# Patient Record
Sex: Female | Born: 1935 | Race: White | Hispanic: No | Marital: Married | Smoking: Former smoker
Health system: Southern US, Community
[De-identification: ages and names within clinical notes are randomized; demographics above are authoritative.]

## PROBLEM LIST (undated history)

## (undated) DIAGNOSIS — E7800 Pure hypercholesterolemia, unspecified: Secondary | ICD-10-CM

## (undated) DIAGNOSIS — E079 Disorder of thyroid, unspecified: Secondary | ICD-10-CM

## (undated) DIAGNOSIS — H409 Unspecified glaucoma: Secondary | ICD-10-CM

## (undated) DIAGNOSIS — I10 Essential (primary) hypertension: Secondary | ICD-10-CM

---

## 2008-06-13 ENCOUNTER — Encounter: Admission: RE | Admit: 2008-06-13 | Discharge: 2008-06-13 | Payer: Self-pay | Admitting: Geriatric Medicine

## 2009-03-10 ENCOUNTER — Other Ambulatory Visit: Admission: RE | Admit: 2009-03-10 | Discharge: 2009-03-10 | Payer: Self-pay | Admitting: Geriatric Medicine

## 2009-06-20 ENCOUNTER — Encounter: Admission: RE | Admit: 2009-06-20 | Discharge: 2009-06-20 | Payer: Self-pay | Admitting: Geriatric Medicine

## 2010-06-22 ENCOUNTER — Encounter: Admission: RE | Admit: 2010-06-22 | Discharge: 2010-06-22 | Payer: Self-pay | Admitting: Geriatric Medicine

## 2011-05-18 ENCOUNTER — Other Ambulatory Visit: Payer: Self-pay | Admitting: Geriatric Medicine

## 2011-05-18 DIAGNOSIS — Z1231 Encounter for screening mammogram for malignant neoplasm of breast: Secondary | ICD-10-CM

## 2011-06-25 ENCOUNTER — Ambulatory Visit
Admission: RE | Admit: 2011-06-25 | Discharge: 2011-06-25 | Disposition: A | Discharge status: Home / Self Care | Payer: Medicare Other | Source: Ambulatory Visit | Attending: Geriatric Medicine | Admitting: Geriatric Medicine

## 2011-06-25 DIAGNOSIS — Z1231 Encounter for screening mammogram for malignant neoplasm of breast: Secondary | ICD-10-CM

## 2012-05-15 ENCOUNTER — Other Ambulatory Visit: Payer: Self-pay | Admitting: Geriatric Medicine

## 2012-05-15 DIAGNOSIS — Z1231 Encounter for screening mammogram for malignant neoplasm of breast: Secondary | ICD-10-CM

## 2012-06-19 ENCOUNTER — Other Ambulatory Visit (HOSPITAL_COMMUNITY): Payer: Self-pay | Admitting: Gastroenterology

## 2012-06-19 DIAGNOSIS — K754 Autoimmune hepatitis: Secondary | ICD-10-CM

## 2012-06-22 ENCOUNTER — Other Ambulatory Visit: Payer: Self-pay | Admitting: Radiology

## 2012-06-26 ENCOUNTER — Ambulatory Visit
Admission: RE | Admit: 2012-06-26 | Discharge: 2012-06-26 | Disposition: A | Discharge status: Home / Self Care | Payer: Medicare Other | Source: Ambulatory Visit | Attending: Geriatric Medicine | Admitting: Geriatric Medicine

## 2012-06-26 DIAGNOSIS — Z1231 Encounter for screening mammogram for malignant neoplasm of breast: Secondary | ICD-10-CM

## 2012-06-29 ENCOUNTER — Encounter (HOSPITAL_COMMUNITY): Payer: Self-pay | Admitting: Pharmacy Technician

## 2012-06-30 ENCOUNTER — Ambulatory Visit (HOSPITAL_COMMUNITY)
Admission: RE | Admit: 2012-06-30 | Discharge: 2012-06-30 | Disposition: A | Discharge status: Home / Self Care | Payer: Medicare Other | Source: Ambulatory Visit | Attending: Gastroenterology | Admitting: Gastroenterology

## 2012-06-30 ENCOUNTER — Encounter (HOSPITAL_COMMUNITY): Payer: Self-pay

## 2012-06-30 DIAGNOSIS — K754 Autoimmune hepatitis: Secondary | ICD-10-CM | POA: Insufficient documentation

## 2012-06-30 DIAGNOSIS — R7989 Other specified abnormal findings of blood chemistry: Secondary | ICD-10-CM | POA: Insufficient documentation

## 2012-06-30 HISTORY — DX: Pure hypercholesterolemia, unspecified: E78.00

## 2012-06-30 HISTORY — DX: Essential (primary) hypertension: I10

## 2012-06-30 HISTORY — DX: Unspecified glaucoma: H40.9

## 2012-06-30 HISTORY — DX: Disorder of thyroid, unspecified: E07.9

## 2012-06-30 LAB — CBC
Platelets: 230 10*3/uL (ref 150–400)
RBC: 4.58 MIL/uL (ref 3.87–5.11)
WBC: 8.3 10*3/uL (ref 4.0–10.5)

## 2012-06-30 LAB — PROTIME-INR: Prothrombin Time: 14.3 seconds (ref 11.6–15.2)

## 2012-06-30 MED ORDER — MIDAZOLAM HCL 2 MG/2ML IJ SOLN
INTRAMUSCULAR | Status: DC | PRN
  Administered 2012-06-30: 0.5 mg via INTRAVENOUS

## 2012-06-30 MED ORDER — FENTANYL CITRATE 0.05 MG/ML IJ SOLN
INTRAMUSCULAR | Status: DC | PRN
  Administered 2012-06-30 (×2): 25 ug via INTRAVENOUS

## 2012-06-30 MED ORDER — FENTANYL CITRATE 0.05 MG/ML IJ SOLN
INTRAMUSCULAR | Status: AC
  Filled 2012-06-30: qty 4

## 2012-06-30 MED ORDER — MIDAZOLAM HCL 2 MG/2ML IJ SOLN
INTRAMUSCULAR | Status: AC
  Filled 2012-06-30: qty 4

## 2012-06-30 MED ORDER — SODIUM CHLORIDE 0.9 % IV SOLN
Freq: Once | INTRAVENOUS | Status: AC
  Administered 2012-06-30: 09:00:00 via INTRAVENOUS

## 2012-06-30 NOTE — H&P (Signed)
Sharon Campbell is an 77 y.o. female.   Chief Complaint: recent diagnosis of elevated liver functions Pt states she has "fatty liver" Scheduled for random liver core biopsy HPI: glaucoma; HTN; high cholesterol; hypothyroid  Past Medical History  Diagnosis Date  . Glaucoma   . Hypertension   . Thyroid disease   . High cholesterol     No past surgical history on file.  No family history on file. Social History:  reports that she has never smoked. She does not have any smokeless tobacco history on file. Her alcohol and drug histories not on file.  Allergies: No Known Allergies   (Not in a hospital admission)  Results for orders placed during the hospital encounter of 06/30/12 (from the past 48 hour(s))  APTT     Status: Normal   Collection Time   06/30/12  9:06 AM      Component Value Range Comment   aPTT 29  24 - 37 seconds   CBC     Status: Normal   Collection Time   06/30/12  9:06 AM      Component Value Range Comment   WBC 8.3  4.0 - 10.5 K/uL    RBC 4.58  3.87 - 5.11 MIL/uL    Hemoglobin 13.8  12.0 - 15.0 g/dL    HCT 16.1  09.6 - 04.5 %    MCV 88.0  78.0 - 100.0 fL    MCH 30.1  26.0 - 34.0 pg    MCHC 34.2  30.0 - 36.0 g/dL    RDW 40.9  81.1 - 91.4 %    Platelets 230  150 - 400 K/uL   PROTIME-INR     Status: Normal   Collection Time   06/30/12  9:06 AM      Component Value Range Comment   Prothrombin Time 14.3  11.6 - 15.2 seconds    INR 1.13  0.00 - 1.49    No results found.  Review of Systems  Constitutional: Negative for fever.  Respiratory: Negative for cough.   Cardiovascular: Negative for chest pain.  Gastrointestinal: Negative for nausea, vomiting and abdominal pain.  Neurological: Negative for headaches.    Blood pressure 162/57, pulse 72, temperature 97.8 F (36.6 C), temperature source Oral, resp. rate 16, height 5\' 1"  (1.549 m), weight 147 lb (66.679 kg), SpO2 98.00%. Physical Exam  Constitutional: She is oriented to person, place, and time.  She appears well-developed and well-nourished.  Cardiovascular: Normal rate, regular rhythm and normal heart sounds.   No murmur heard. Respiratory: Effort normal and breath sounds normal. She has no wheezes.  GI: Bowel sounds are normal. There is no tenderness.  Neurological: She is alert and oriented to person, place, and time.  Psychiatric: She has a normal mood and affect. Her behavior is normal. Judgment and thought content normal.     Assessment/Plan Elevated liver fxn Scheduled for liver core bx Pt aware of procedure benefits and risks and agreeable to proceed Consent signed and in chart  Adelheid Hoggard A 06/30/2012, 9:36 AM

## 2012-06-30 NOTE — Procedures (Signed)
Successful random liver core biopsy No comp Stable Full report in pacs

## 2012-06-30 NOTE — Progress Notes (Signed)
PER DR HASSELL OK TO D/C AT 1330

## 2012-06-30 NOTE — ED Notes (Signed)
O2 removed

## 2012-07-03 ENCOUNTER — Telehealth (HOSPITAL_COMMUNITY): Payer: Self-pay | Admitting: *Deleted

## 2013-04-05 ENCOUNTER — Other Ambulatory Visit: Payer: Self-pay

## 2013-04-05 DIAGNOSIS — Z1231 Encounter for screening mammogram for malignant neoplasm of breast: Secondary | ICD-10-CM

## 2013-06-27 ENCOUNTER — Ambulatory Visit
Admission: RE | Admit: 2013-06-27 | Discharge: 2013-06-27 | Disposition: A | Discharge status: Home / Self Care | Payer: Medicare Other | Source: Ambulatory Visit

## 2013-06-27 DIAGNOSIS — Z1231 Encounter for screening mammogram for malignant neoplasm of breast: Secondary | ICD-10-CM

## 2013-09-05 ENCOUNTER — Other Ambulatory Visit: Payer: Self-pay | Admitting: Dermatology

## 2014-01-18 ENCOUNTER — Other Ambulatory Visit (HOSPITAL_COMMUNITY): Payer: Medicare Other

## 2014-01-25 ENCOUNTER — Ambulatory Visit (HOSPITAL_COMMUNITY): Payer: Medicare Other | Attending: Geriatric Medicine | Admitting: Cardiology

## 2014-01-25 ENCOUNTER — Other Ambulatory Visit (HOSPITAL_COMMUNITY): Payer: Self-pay | Admitting: Cardiology

## 2014-01-25 DIAGNOSIS — I359 Nonrheumatic aortic valve disorder, unspecified: Secondary | ICD-10-CM

## 2014-01-25 NOTE — Progress Notes (Signed)
Echo performed. 

## 2014-02-18 ENCOUNTER — Other Ambulatory Visit: Payer: Self-pay | Admitting: Dermatology

## 2014-04-22 ENCOUNTER — Other Ambulatory Visit: Payer: Self-pay

## 2014-04-22 DIAGNOSIS — Z1231 Encounter for screening mammogram for malignant neoplasm of breast: Secondary | ICD-10-CM

## 2014-06-28 ENCOUNTER — Ambulatory Visit
Admission: RE | Admit: 2014-06-28 | Discharge: 2014-06-28 | Disposition: A | Discharge status: Home / Self Care | Payer: Medicare Other | Source: Ambulatory Visit

## 2014-06-28 DIAGNOSIS — Z1231 Encounter for screening mammogram for malignant neoplasm of breast: Secondary | ICD-10-CM

## 2014-11-19 ENCOUNTER — Other Ambulatory Visit: Payer: Self-pay | Admitting: Nurse Practitioner

## 2014-11-19 ENCOUNTER — Ambulatory Visit
Admission: RE | Admit: 2014-11-19 | Discharge: 2014-11-19 | Disposition: A | Discharge status: Home / Self Care | Payer: Medicare Other | Source: Ambulatory Visit | Attending: Nurse Practitioner | Admitting: Nurse Practitioner

## 2014-11-19 DIAGNOSIS — T148XXA Other injury of unspecified body region, initial encounter: Secondary | ICD-10-CM

## 2015-05-06 ENCOUNTER — Other Ambulatory Visit: Payer: Self-pay

## 2015-05-06 DIAGNOSIS — Z1231 Encounter for screening mammogram for malignant neoplasm of breast: Secondary | ICD-10-CM

## 2015-06-24 IMAGING — CR DG FACIAL BONES COMPLETE 3+V
3 series · 3 of 3 positions shown · non-contrast
Comparison: None.

CLINICAL DATA: 79-year-old female with history of trauma after
falling onto her effaced on her driveway today.

EXAM:
FACIAL BONES COMPLETE 3+V

[[person_name]]
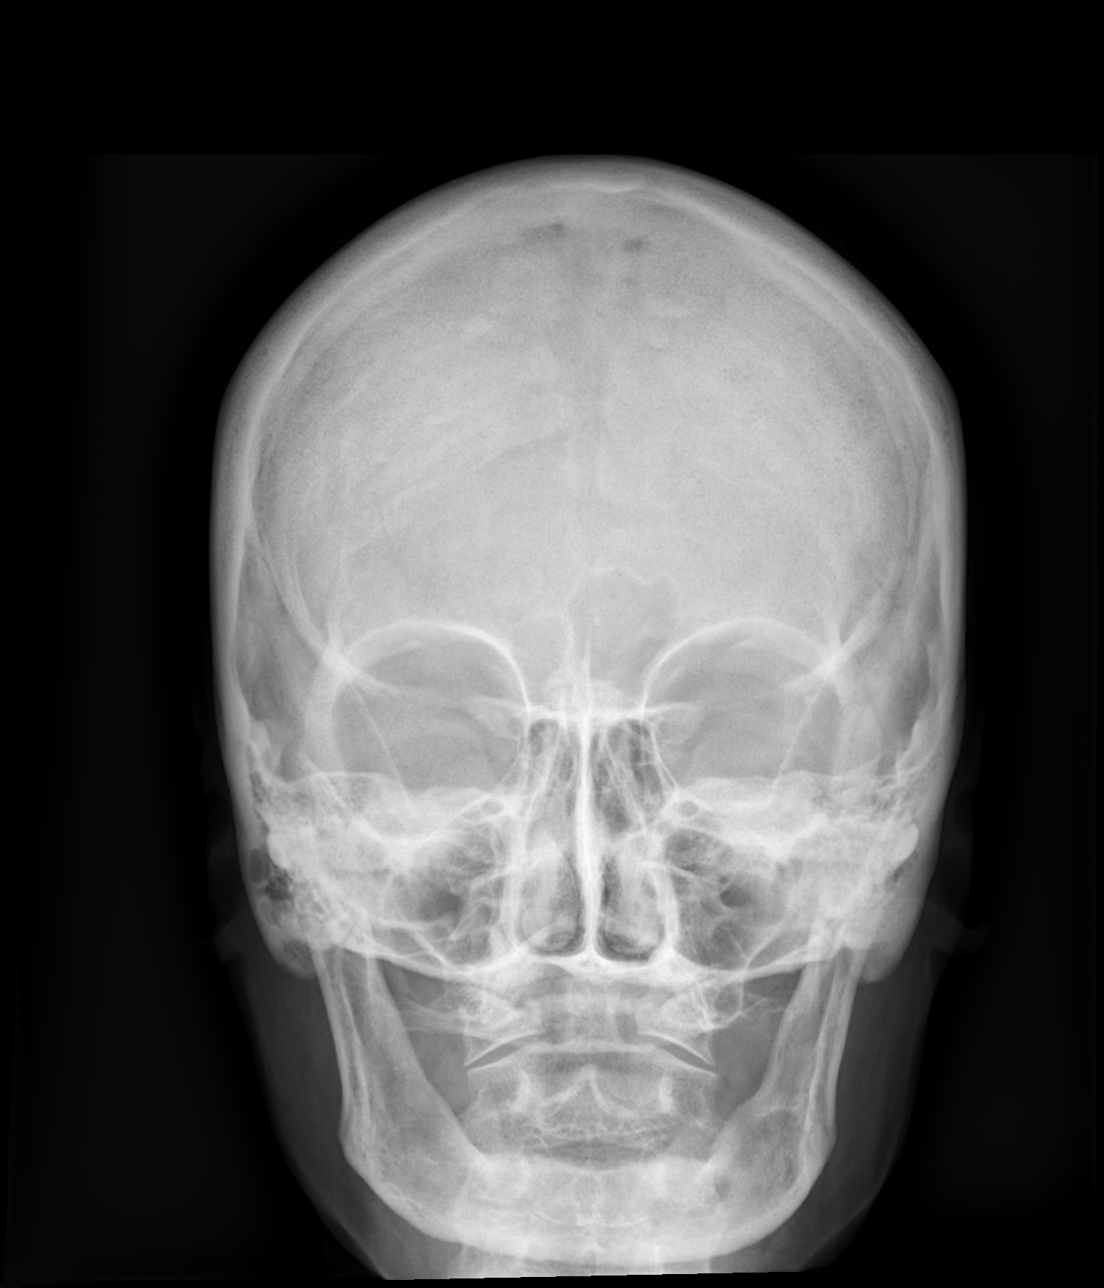

[w waters]
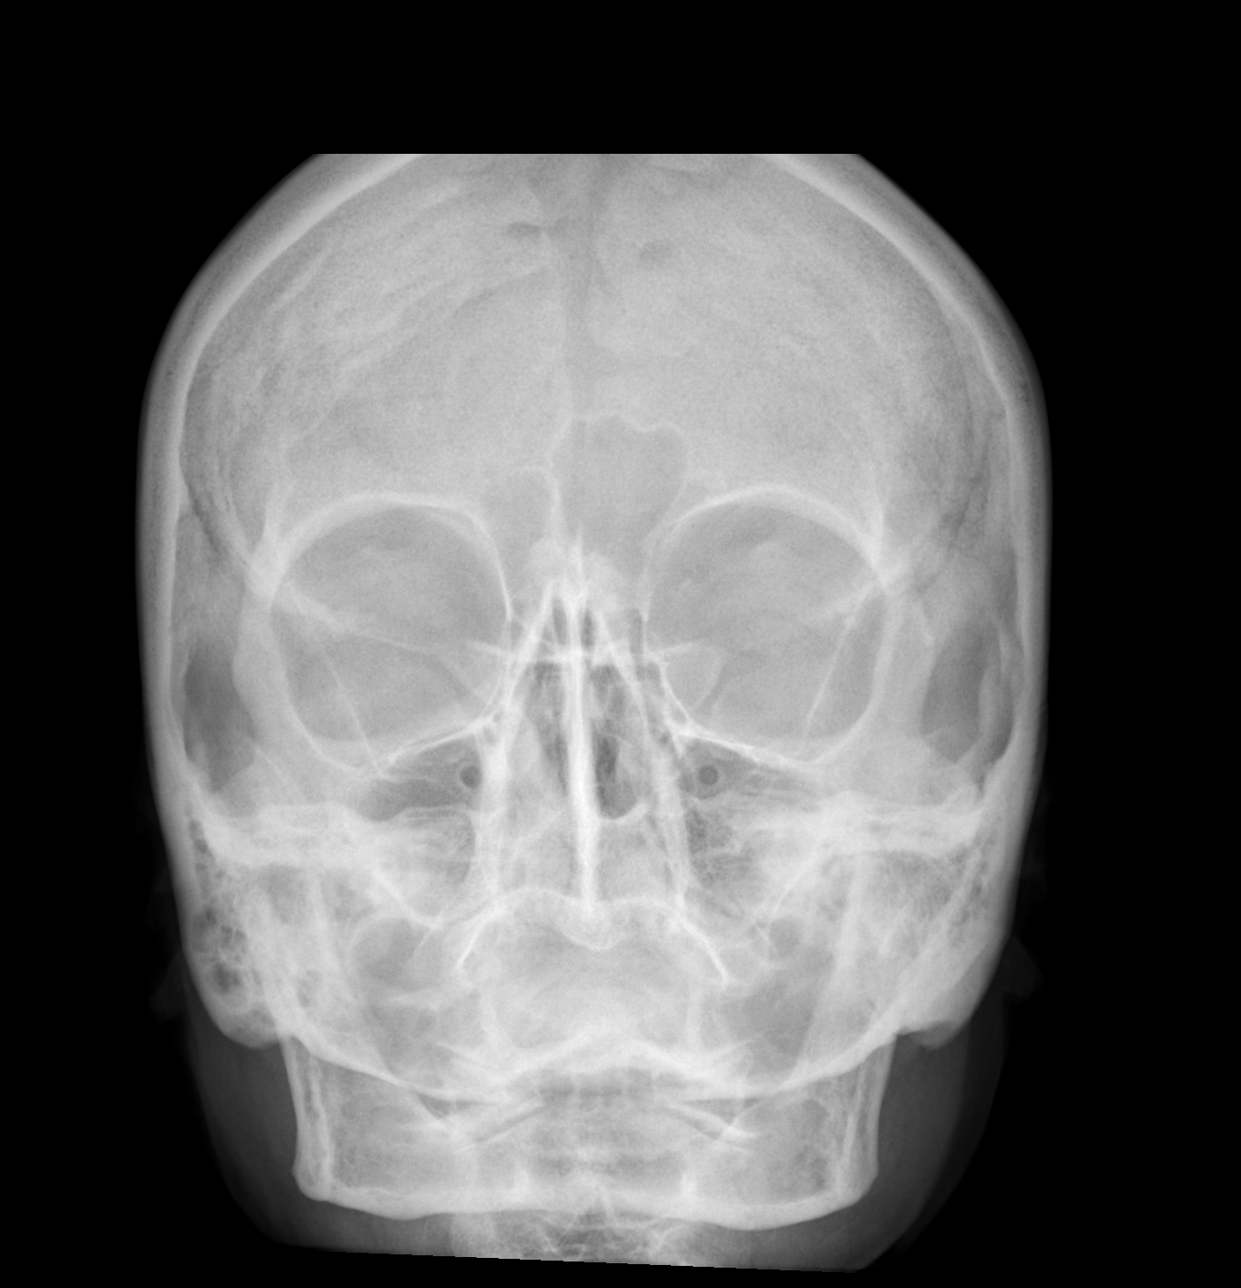

[w skull lat]
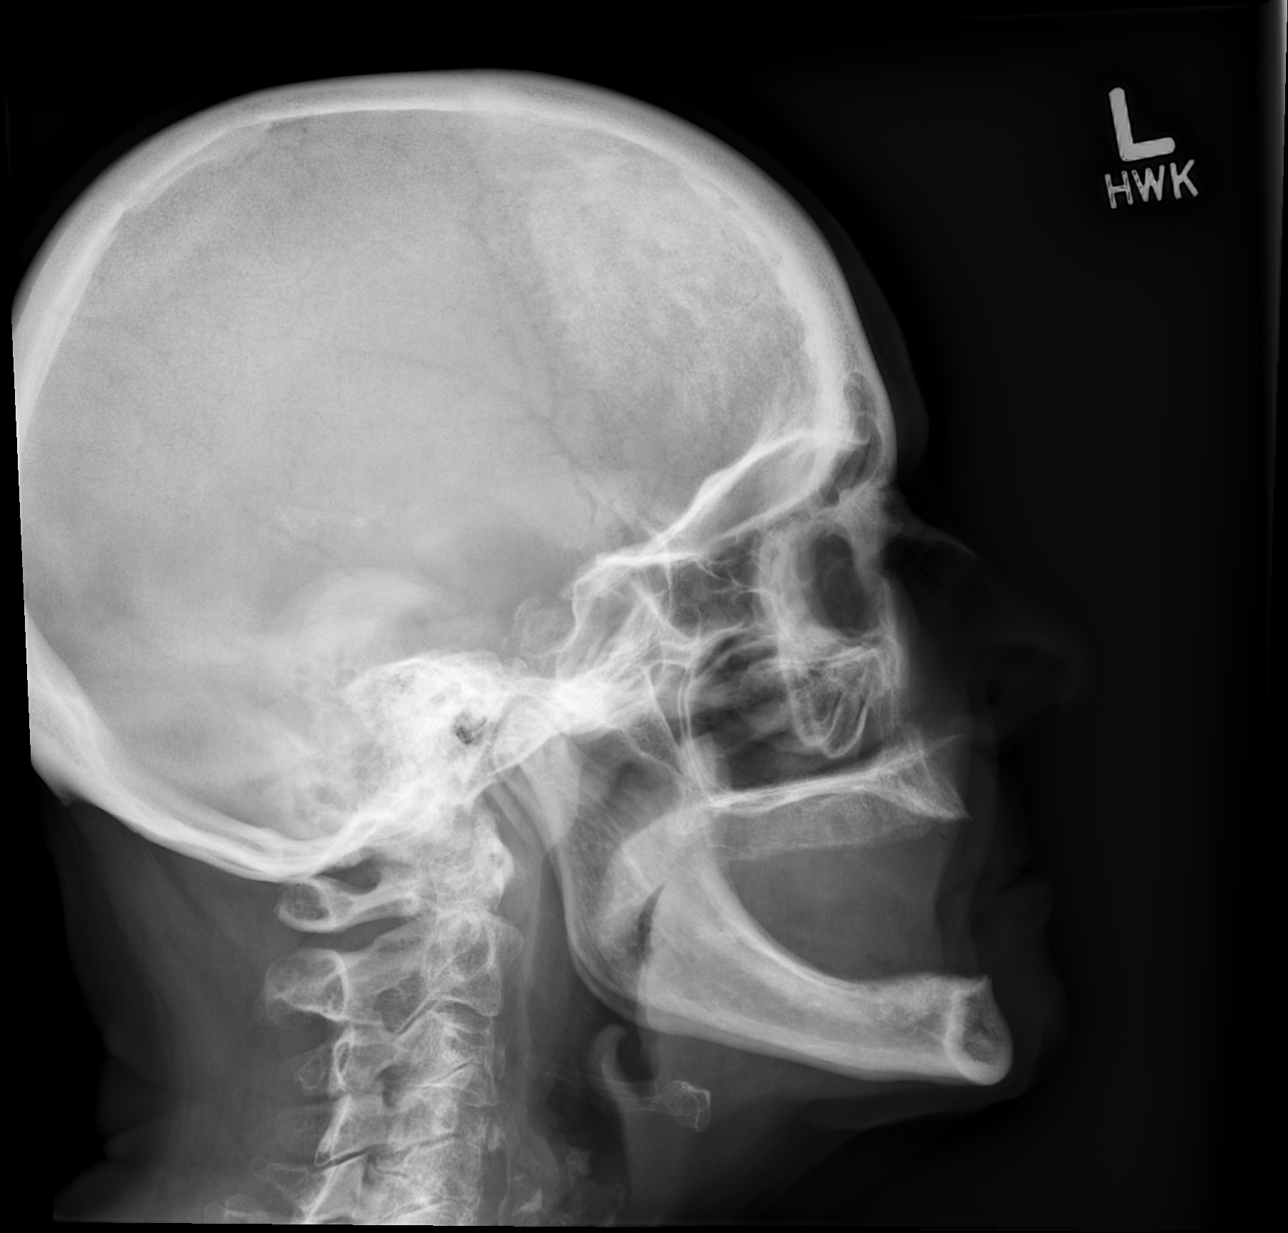

[3 of 3 positions shown; findings below may reference images not displayed]

FINDINGS: There is no evidence of fracture or other significant bone
abnormality. No orbital emphysema or sinus air-fluid levels are
seen.
IMPRESSION: Negative.

## 2015-07-02 ENCOUNTER — Ambulatory Visit
Admission: RE | Admit: 2015-07-02 | Discharge: 2015-07-02 | Disposition: A | Discharge status: Home / Self Care | Payer: Medicare Other | Source: Ambulatory Visit

## 2015-07-02 DIAGNOSIS — Z1231 Encounter for screening mammogram for malignant neoplasm of breast: Secondary | ICD-10-CM

## 2016-05-05 ENCOUNTER — Other Ambulatory Visit: Payer: Self-pay | Admitting: Geriatric Medicine

## 2016-05-05 DIAGNOSIS — Z1231 Encounter for screening mammogram for malignant neoplasm of breast: Secondary | ICD-10-CM

## 2016-07-07 ENCOUNTER — Ambulatory Visit
Admission: RE | Admit: 2016-07-07 | Discharge: 2016-07-07 | Disposition: A | Discharge status: Home / Self Care | Payer: Medicare Other | Source: Ambulatory Visit | Attending: Geriatric Medicine | Admitting: Geriatric Medicine

## 2016-07-07 DIAGNOSIS — Z1231 Encounter for screening mammogram for malignant neoplasm of breast: Secondary | ICD-10-CM

## 2016-07-09 ENCOUNTER — Other Ambulatory Visit: Payer: Self-pay | Admitting: Geriatric Medicine

## 2016-07-09 DIAGNOSIS — R928 Other abnormal and inconclusive findings on diagnostic imaging of breast: Secondary | ICD-10-CM

## 2016-07-16 ENCOUNTER — Ambulatory Visit
Admission: RE | Admit: 2016-07-16 | Discharge: 2016-07-16 | Disposition: A | Discharge status: Home / Self Care | Payer: Medicare Other | Source: Ambulatory Visit | Attending: Geriatric Medicine | Admitting: Geriatric Medicine

## 2016-07-16 DIAGNOSIS — R928 Other abnormal and inconclusive findings on diagnostic imaging of breast: Secondary | ICD-10-CM

## 2017-03-01 ENCOUNTER — Other Ambulatory Visit: Payer: Self-pay

## 2017-03-01 ENCOUNTER — Ambulatory Visit (HOSPITAL_COMMUNITY): Payer: Medicare Other | Attending: Cardiovascular Disease

## 2017-03-01 ENCOUNTER — Other Ambulatory Visit: Payer: Self-pay | Admitting: Geriatric Medicine

## 2017-03-01 DIAGNOSIS — I5030 Unspecified diastolic (congestive) heart failure: Secondary | ICD-10-CM | POA: Diagnosis not present

## 2017-03-01 DIAGNOSIS — I061 Rheumatic aortic insufficiency: Secondary | ICD-10-CM | POA: Insufficient documentation

## 2017-03-01 DIAGNOSIS — I340 Nonrheumatic mitral (valve) insufficiency: Secondary | ICD-10-CM | POA: Diagnosis not present

## 2017-03-14 ENCOUNTER — Ambulatory Visit: Payer: Medicare Other | Admitting: Cardiology

## 2017-06-08 ENCOUNTER — Other Ambulatory Visit: Payer: Self-pay | Admitting: Geriatric Medicine

## 2017-06-08 DIAGNOSIS — Z1231 Encounter for screening mammogram for malignant neoplasm of breast: Secondary | ICD-10-CM

## 2017-07-12 ENCOUNTER — Ambulatory Visit
Admission: RE | Admit: 2017-07-12 | Discharge: 2017-07-12 | Disposition: A | Discharge status: Home / Self Care | Payer: Medicare Other | Source: Ambulatory Visit | Attending: Geriatric Medicine | Admitting: Geriatric Medicine

## 2017-07-12 ENCOUNTER — Encounter (INDEPENDENT_AMBULATORY_CARE_PROVIDER_SITE_OTHER): Payer: Self-pay

## 2017-07-12 DIAGNOSIS — Z1231 Encounter for screening mammogram for malignant neoplasm of breast: Secondary | ICD-10-CM

## 2018-06-02 ENCOUNTER — Other Ambulatory Visit: Payer: Self-pay | Admitting: Geriatric Medicine

## 2018-06-02 DIAGNOSIS — Z1231 Encounter for screening mammogram for malignant neoplasm of breast: Secondary | ICD-10-CM

## 2018-07-14 ENCOUNTER — Ambulatory Visit
Admission: RE | Admit: 2018-07-14 | Discharge: 2018-07-14 | Disposition: A | Discharge status: Home / Self Care | Payer: Medicare Other | Source: Ambulatory Visit | Attending: Geriatric Medicine | Admitting: Geriatric Medicine

## 2018-07-14 DIAGNOSIS — Z1231 Encounter for screening mammogram for malignant neoplasm of breast: Secondary | ICD-10-CM

## 2019-04-18 ENCOUNTER — Other Ambulatory Visit: Payer: Self-pay | Admitting: Geriatric Medicine

## 2019-04-18 DIAGNOSIS — Z1231 Encounter for screening mammogram for malignant neoplasm of breast: Secondary | ICD-10-CM

## 2019-07-17 ENCOUNTER — Other Ambulatory Visit: Payer: Self-pay

## 2019-07-17 ENCOUNTER — Ambulatory Visit
Admission: RE | Admit: 2019-07-17 | Discharge: 2019-07-17 | Disposition: A | Discharge status: Home / Self Care | Payer: Medicare Other | Source: Ambulatory Visit | Attending: Geriatric Medicine | Admitting: Geriatric Medicine

## 2019-07-17 DIAGNOSIS — Z1231 Encounter for screening mammogram for malignant neoplasm of breast: Secondary | ICD-10-CM

## 2020-06-05 ENCOUNTER — Other Ambulatory Visit: Payer: Self-pay | Admitting: Geriatric Medicine

## 2020-06-05 DIAGNOSIS — Z1231 Encounter for screening mammogram for malignant neoplasm of breast: Secondary | ICD-10-CM

## 2020-07-17 ENCOUNTER — Ambulatory Visit: Payer: Medicare Other

## 2020-07-18 ENCOUNTER — Ambulatory Visit: Payer: Medicare Other

## 2020-07-29 ENCOUNTER — Other Ambulatory Visit: Payer: Self-pay

## 2020-07-29 ENCOUNTER — Ambulatory Visit
Admission: RE | Admit: 2020-07-29 | Discharge: 2020-07-29 | Disposition: A | Discharge status: Home / Self Care | Payer: Medicare Other | Source: Ambulatory Visit | Attending: Geriatric Medicine | Admitting: Geriatric Medicine

## 2020-07-29 DIAGNOSIS — Z1231 Encounter for screening mammogram for malignant neoplasm of breast: Secondary | ICD-10-CM

## 2020-09-08 DIAGNOSIS — N1832 Chronic kidney disease, stage 3b: Secondary | ICD-10-CM | POA: Diagnosis not present

## 2020-09-08 DIAGNOSIS — I340 Nonrheumatic mitral (valve) insufficiency: Secondary | ICD-10-CM | POA: Diagnosis not present

## 2020-09-08 DIAGNOSIS — I129 Hypertensive chronic kidney disease with stage 1 through stage 4 chronic kidney disease, or unspecified chronic kidney disease: Secondary | ICD-10-CM | POA: Diagnosis not present

## 2020-11-05 DIAGNOSIS — R7303 Prediabetes: Secondary | ICD-10-CM | POA: Diagnosis not present

## 2020-11-05 DIAGNOSIS — I129 Hypertensive chronic kidney disease with stage 1 through stage 4 chronic kidney disease, or unspecified chronic kidney disease: Secondary | ICD-10-CM | POA: Diagnosis not present

## 2020-11-05 DIAGNOSIS — Z79899 Other long term (current) drug therapy: Secondary | ICD-10-CM | POA: Diagnosis not present

## 2020-11-05 DIAGNOSIS — I340 Nonrheumatic mitral (valve) insufficiency: Secondary | ICD-10-CM | POA: Diagnosis not present

## 2020-11-05 DIAGNOSIS — N1832 Chronic kidney disease, stage 3b: Secondary | ICD-10-CM | POA: Diagnosis not present

## 2020-11-05 DIAGNOSIS — D649 Anemia, unspecified: Secondary | ICD-10-CM | POA: Diagnosis not present

## 2020-11-25 DIAGNOSIS — M79671 Pain in right foot: Secondary | ICD-10-CM | POA: Diagnosis not present

## 2020-11-25 DIAGNOSIS — E611 Iron deficiency: Secondary | ICD-10-CM | POA: Diagnosis not present

## 2020-11-28 DIAGNOSIS — K649 Unspecified hemorrhoids: Secondary | ICD-10-CM | POA: Diagnosis not present

## 2020-11-28 DIAGNOSIS — Z1211 Encounter for screening for malignant neoplasm of colon: Secondary | ICD-10-CM | POA: Diagnosis not present

## 2020-11-28 DIAGNOSIS — D509 Iron deficiency anemia, unspecified: Secondary | ICD-10-CM | POA: Diagnosis not present

## 2021-03-13 DIAGNOSIS — Z79899 Other long term (current) drug therapy: Secondary | ICD-10-CM | POA: Diagnosis not present

## 2021-03-13 DIAGNOSIS — I340 Nonrheumatic mitral (valve) insufficiency: Secondary | ICD-10-CM | POA: Diagnosis not present

## 2021-03-13 DIAGNOSIS — I351 Nonrheumatic aortic (valve) insufficiency: Secondary | ICD-10-CM | POA: Diagnosis not present

## 2021-03-13 DIAGNOSIS — Z1389 Encounter for screening for other disorder: Secondary | ICD-10-CM | POA: Diagnosis not present

## 2021-03-13 DIAGNOSIS — E039 Hypothyroidism, unspecified: Secondary | ICD-10-CM | POA: Diagnosis not present

## 2021-03-13 DIAGNOSIS — D509 Iron deficiency anemia, unspecified: Secondary | ICD-10-CM | POA: Diagnosis not present

## 2021-03-13 DIAGNOSIS — Z0000 Encounter for general adult medical examination without abnormal findings: Secondary | ICD-10-CM | POA: Diagnosis not present

## 2021-03-13 DIAGNOSIS — Z7189 Other specified counseling: Secondary | ICD-10-CM | POA: Diagnosis not present

## 2021-03-13 DIAGNOSIS — N1832 Chronic kidney disease, stage 3b: Secondary | ICD-10-CM | POA: Diagnosis not present

## 2021-03-13 DIAGNOSIS — R7303 Prediabetes: Secondary | ICD-10-CM | POA: Diagnosis not present

## 2021-03-13 DIAGNOSIS — E7800 Pure hypercholesterolemia, unspecified: Secondary | ICD-10-CM | POA: Diagnosis not present

## 2021-07-01 ENCOUNTER — Other Ambulatory Visit: Payer: Self-pay | Admitting: Geriatric Medicine

## 2021-07-01 DIAGNOSIS — Z1231 Encounter for screening mammogram for malignant neoplasm of breast: Secondary | ICD-10-CM

## 2021-08-06 ENCOUNTER — Ambulatory Visit
Admission: RE | Admit: 2021-08-06 | Discharge: 2021-08-06 | Disposition: A | Discharge status: Home / Self Care | Payer: Medicare Other | Source: Ambulatory Visit | Attending: Geriatric Medicine | Admitting: Geriatric Medicine

## 2021-08-06 DIAGNOSIS — Z1231 Encounter for screening mammogram for malignant neoplasm of breast: Secondary | ICD-10-CM | POA: Diagnosis not present

## 2021-09-06 ENCOUNTER — Encounter (HOSPITAL_COMMUNITY): Payer: Self-pay | Admitting: Emergency Medicine

## 2021-09-06 ENCOUNTER — Observation Stay (HOSPITAL_COMMUNITY)
Admission: EM | Admit: 2021-09-06 | Discharge: 2021-09-08 | Disposition: A | Discharge status: Home / Self Care | Payer: Medicare Other | Attending: Family Medicine | Admitting: Family Medicine

## 2021-09-06 ENCOUNTER — Emergency Department (HOSPITAL_COMMUNITY): Payer: Medicare Other

## 2021-09-06 ENCOUNTER — Other Ambulatory Visit: Payer: Self-pay

## 2021-09-06 DIAGNOSIS — R404 Transient alteration of awareness: Secondary | ICD-10-CM | POA: Diagnosis not present

## 2021-09-06 DIAGNOSIS — I110 Hypertensive heart disease with heart failure: Secondary | ICD-10-CM | POA: Insufficient documentation

## 2021-09-06 DIAGNOSIS — Z79899 Other long term (current) drug therapy: Secondary | ICD-10-CM | POA: Insufficient documentation

## 2021-09-06 DIAGNOSIS — R002 Palpitations: Secondary | ICD-10-CM | POA: Insufficient documentation

## 2021-09-06 DIAGNOSIS — R778 Other specified abnormalities of plasma proteins: Secondary | ICD-10-CM | POA: Insufficient documentation

## 2021-09-06 DIAGNOSIS — I351 Nonrheumatic aortic (valve) insufficiency: Secondary | ICD-10-CM

## 2021-09-06 DIAGNOSIS — Z20822 Contact with and (suspected) exposure to covid-19: Secondary | ICD-10-CM | POA: Insufficient documentation

## 2021-09-06 DIAGNOSIS — R55 Syncope and collapse: Secondary | ICD-10-CM | POA: Diagnosis not present

## 2021-09-06 DIAGNOSIS — R011 Cardiac murmur, unspecified: Secondary | ICD-10-CM

## 2021-09-06 DIAGNOSIS — N179 Acute kidney failure, unspecified: Secondary | ICD-10-CM | POA: Insufficient documentation

## 2021-09-06 DIAGNOSIS — I10 Essential (primary) hypertension: Secondary | ICD-10-CM | POA: Diagnosis not present

## 2021-09-06 DIAGNOSIS — Y93E8 Activity, other personal hygiene: Secondary | ICD-10-CM | POA: Insufficient documentation

## 2021-09-06 DIAGNOSIS — I517 Cardiomegaly: Secondary | ICD-10-CM | POA: Diagnosis not present

## 2021-09-06 DIAGNOSIS — Z743 Need for continuous supervision: Secondary | ICD-10-CM | POA: Diagnosis not present

## 2021-09-06 DIAGNOSIS — R6889 Other general symptoms and signs: Secondary | ICD-10-CM | POA: Diagnosis not present

## 2021-09-06 DIAGNOSIS — R112 Nausea with vomiting, unspecified: Secondary | ICD-10-CM | POA: Diagnosis not present

## 2021-09-06 DIAGNOSIS — W1839XA Other fall on same level, initial encounter: Secondary | ICD-10-CM | POA: Diagnosis not present

## 2021-09-06 DIAGNOSIS — J9811 Atelectasis: Secondary | ICD-10-CM | POA: Diagnosis not present

## 2021-09-06 DIAGNOSIS — I5032 Chronic diastolic (congestive) heart failure: Secondary | ICD-10-CM | POA: Diagnosis not present

## 2021-09-06 DIAGNOSIS — R7989 Other specified abnormal findings of blood chemistry: Secondary | ICD-10-CM

## 2021-09-06 LAB — CBC
HCT: 34.8 % — ABNORMAL LOW (ref 36.0–46.0)
Hemoglobin: 11 g/dL — ABNORMAL LOW (ref 12.0–15.0)
MCH: 28.6 pg (ref 26.0–34.0)
MCHC: 31.6 g/dL (ref 30.0–36.0)
MCV: 90.4 fL (ref 80.0–100.0)
Platelets: 246 10*3/uL (ref 150–400)
RBC: 3.85 MIL/uL — ABNORMAL LOW (ref 3.87–5.11)
RDW: 14.1 % (ref 11.5–15.5)
WBC: 7.4 10*3/uL (ref 4.0–10.5)
nRBC: 0 % (ref 0.0–0.2)

## 2021-09-06 LAB — BASIC METABOLIC PANEL
Anion gap: 10 (ref 5–15)
BUN: 33 mg/dL — ABNORMAL HIGH (ref 8–23)
CO2: 26 mmol/L (ref 22–32)
Calcium: 9.3 mg/dL (ref 8.9–10.3)
Chloride: 102 mmol/L (ref 98–111)
Creatinine, Ser: 1.33 mg/dL — ABNORMAL HIGH (ref 0.44–1.00)
GFR, Estimated: 39 mL/min — ABNORMAL LOW (ref >60)
Glucose, Bld: 118 mg/dL — ABNORMAL HIGH (ref 70–99)
Potassium: 4.1 mmol/L (ref 3.5–5.1)
Sodium: 138 mmol/L (ref 135–145)

## 2021-09-06 LAB — CBG MONITORING, ED: Glucose-Capillary: 106 mg/dL — ABNORMAL HIGH (ref 70–99)

## 2021-09-06 NOTE — ED Triage Notes (Signed)
Pt arrived via EMS from home. Pt has been feeling nauseous throughout the day. Pt had a syncopal episode while bending over and hit her head on the padded carpet. Pt denies blood thinners. Pt denies head, neck, or back pain.

## 2021-09-06 NOTE — ED Provider Notes (Signed)
[facility] DEPT Provider Note   CSN: [id number] Arrival date & time: 09/06/21  2229     History  Chief Complaint  Patient presents with   Loss of Consciousness   Fall    Sharon Campbell is a 87 y.o. female.   Loss of Consciousness Fall   87 year old female with a medical history significant for hypertension, thyroid disease, hyperlipidemia, echocardiogram in 2018 which revealed moderate aortic regurgitation who presents to the emergency department with a near syncopal episode.  The patient states that she was under some stress because her husband likely has undiagnosed dementia and has been causing her increased stress at home.  She felt stressed and Felt nauseated earlier today.  She felt diaphoretic and bent over and had a episode of heart palpitations and near syncope.  She states that she went down to the ground, did not hit her head or lose consciousness.  She is not on blood thinners.  She denies any headache, neck pain or back pain.  She states that she lowered herself to the ground and felt better after being on the ground for short period of time.  No further episodes of near syncope.  No episodes of full syncope.  She denies any chest pain or shortness of breath.  No fevers or chills.  No neurologic complaints.  She is back to her baseline at this time.  Home Medications Prior to Admission medications   Medication Sig Start Date End Date Taking? Authorizing Provider  acetaminophen (TYLENOL) 500 MG tablet Take 500 mg by mouth daily as needed for headache.   Yes [provider]  ALPRAZolam (XANAX) 0.25 MG tablet Take 0.5 tablets by mouth at bedtime. For anxiety 06/01/12  Yes [provider]  atorvastatin (LIPITOR) 10 MG tablet Take 10 mg by mouth daily. 09/01/21  Yes [provider]  dorzolamide-timolol (COSOPT) 22.3-6.8 MG/ML ophthalmic solution Place 1 drop into the right eye at bedtime. 06/24/12  Yes [provider]  fluvoxaMINE (LUVOX) 100 MG tablet Take 200 tablets by mouth at bedtime. 06/18/12  Yes [provider]  latanoprost (XALATAN) 0.005 % ophthalmic solution Place 1 drop into both eyes at bedtime.  06/19/12  Yes [provider]  Multiple Vitamin (MULTIVITAMIN WITH MINERALS) TABS Take 1 tablet by mouth daily.   Yes [provider]  SYNTHROID 88 MCG tablet Take 88 mcg by mouth daily.  06/12/12  Yes [provider]      Allergies    Patient has no known allergies.    Review of Systems   Review of Systems  Cardiovascular:  Positive for syncope.   Physical Exam Updated Vital Signs BP (!) 173/80    Pulse 71    Temp (!) 97.4 F (36.3 C) (Oral)    Resp 16    Ht 5' (1.524 m)    Wt 51.3 kg    SpO2 96%    BMI 22.07 kg/m  Physical Exam Vitals and nursing note reviewed.  Constitutional:      General: She is not in acute distress.    Appearance: She is well-developed.  HENT:     Head: Normocephalic and atraumatic.  Eyes:     Conjunctiva/sclera: Conjunctivae normal.     Pupils: Pupils are equal, round, and reactive to light.  Cardiovascular:     Rate and Rhythm: Normal rate and regular rhythm.     Chest Wall: No thrill.     Heart sounds: Murmur heard.  Systolic murmur is present.  Pulmonary:     Effort: Pulmonary effort is normal. No respiratory distress.     Breath sounds: Normal breath sounds.  Abdominal:     General: There is no distension.     Palpations: Abdomen is soft.     Tenderness: There is no abdominal tenderness. There is no guarding.  Musculoskeletal:        General: No swelling, deformity or signs of injury.     Cervical back: Neck supple.  Skin:    General: Skin is warm and dry.     Capillary Refill: Capillary refill takes less than 2 seconds.     Findings: No lesion or rash.  Neurological:     General: No focal deficit present.     Mental Status: She is alert. Mental status is at baseline.  Psychiatric:        Mood  and Affect: Mood normal.    ED Results / Procedures / Treatments   Labs (all labs ordered are listed, but only abnormal results are displayed) Labs Reviewed  BASIC METABOLIC PANEL - Abnormal; Notable for the following components:      Result Value   Glucose, Bld 118 (*)    BUN 33 (*)    Creatinine, Ser 1.33 (*)    GFR, Estimated 39 (*)    All other components within normal limits  CBC - Abnormal; Notable for the following components:   RBC 3.85 (*)    Hemoglobin 11.0 (*)    HCT 34.8 (*)    All other components within normal limits  CBG MONITORING, ED - Abnormal; Notable for the following components:   Glucose-Capillary 106 (*)    All other components within normal limits  TROPONIN I (HIGH SENSITIVITY) - Abnormal; Notable for the following components:   Troponin I (High Sensitivity) 74 (*)    All other components within normal limits  TROPONIN I (HIGH SENSITIVITY) - Abnormal; Notable for the following components:   Troponin I (High Sensitivity) 81 (*)    All other components within normal limits  RESP PANEL BY RT-PCR (FLU A&B, COVID) ARPGX2  URINALYSIS, ROUTINE W REFLEX MICROSCOPIC    EKG EKG Interpretation  Date/Time:  Sunday September 06 2021 22:48:43 EST Ventricular Rate:  69 PR Interval:  180 QRS Duration: 86 QT Interval:  411 QTC Calculation: 441 R Axis:   42 Text Interpretation: Sinus rhythm Probable left atrial enlargement Repol abnrm, severe global ischemia (LM/MVD) Artifact in lead(s) I II aVR aVL aVF Confirmed by Regan Lemming ([id number]) on 09/06/2021 11:24:38 PM  Radiology DG Chest Portable 1 View  Result Date: 09/06/2021 CLINICAL DATA:  Syncope. EXAM: PORTABLE CHEST 1 VIEW COMPARISON:  None. FINDINGS: No focal consolidation, pleural effusion or pneumothorax. Minimal left lung base atelectasis. Borderline cardiomegaly. Atherosclerotic calcification of the aorta. No acute osseous pathology. IMPRESSION: No active disease. Electronically Signed   By: Anner Crete M.D.    On: 09/06/2021 23:25    Procedures Procedures    Medications Ordered in ED Medications - No data to display  ED Course/ Medical Decision Making/ A&P                           Medical Decision Making  87 year old female with a medical history significant for hypertension, thyroid disease, hyperlipidemia, echocardiogram in 2018 which revealed moderate aortic regurgitation who presents to the emergency department with a near syncopal episode.  The patient states that she was under some stress because her husband likely has  undiagnosed dementia and has been causing her increased stress at home.  She felt stressed and Felt nauseated earlier today.  She felt diaphoretic and bent over and had a episode of heart palpitations and near syncope.  She states that she went down to the ground, did not hit her head or lose consciousness.  She is not on blood thinners.  She denies any headache, neck pain or back pain.  She states that she lowered herself to the ground and felt better after being on the ground for short period of time.  No further episodes of near syncope.  No episodes of full syncope.  She denies any chest pain or shortness of breath.  No fevers or chills.  No neurologic complaints.  She is back to her baseline at this time.  On arrival, the patient was afebrile, not tachycardic or tachypneic, hypertensive BP 160/62, saturating 98% on room air.  Cardiac telemetry was reviewed and revealed normal sinus rhythm.  An EKG was performed and did not reveal acute ST segment changes to indicate coronary ischemia.  The patient denies symptoms of ACS at this time.  She presents with a near syncopal episode  Differential diagnosis includes vasovagal syncope, cardiogenic etiology of her syncope given her history of structural cardiac abnormalities with moderate aortic regurgitation.  Patient denies any chest pain or shortness of breath at this time.  Low concern for PE.  Low concern for ACS.  Low concern for  aortic dissection or AAA.  Did consider worsening valvular abnormality as the patient has a large holosystolic murmur noted on exam.  Patient under went chest x-ray imaging which revealed no focal cardiac or pulmonary abnormality.  Chest x-ray imaging was reviewed myself and radiology.  No evidence of widening mediastinum.  No evidence of focal consolidation to suggest pneumonia.  Laboratory work-up significant for hemoglobin of 11, no leukocytosis or platelet abnormality, BMP generally unremarkable.  CBG normal.  Patient's creatinine was elevated to 1.33 and had an elevated BUN to 33.  There is no prior BMP measurements for comparison of the patient's baseline creatinine.  Additionally, there is a years ago baseline hemoglobin of 13.  The patient denies any rectal bleeding, hematochezia or melena.  Low concern for acute GI bleed at this time.  Patient's initial troponin was elevated to 74.  I spoke with on-call cardiology who did recommend admission for observation on cardiac telemetry and repeat echocardiogram to given her history of aortic dilatation and near syncopal episode earlier tonight.  A repeat troponin was found to be flat at 81.  I do not think the patient needs transfer for admission to the cardiology service at this time after discussion with cardiology with flat troponins.  We will plan for admission to the hospitalist service with cardiology consulting for observation, telemetry in the setting of a near syncopal episode and repeat echocardiogram inpatient.  Hospitalist medicine consulted for admission.   Final Clinical Impression(s) / ED Diagnoses Final diagnoses:  Systolic murmur  Aortic valve insufficiency, etiology of cardiac valve disease unspecified  Elevated troponin  Near syncope    Rx / DC Orders ED Discharge Orders     None         Regan Lemming, MD 09/07/21 [phone]

## 2021-09-07 ENCOUNTER — Observation Stay (HOSPITAL_BASED_OUTPATIENT_CLINIC_OR_DEPARTMENT_OTHER): Payer: Medicare Other

## 2021-09-07 ENCOUNTER — Encounter (HOSPITAL_COMMUNITY): Payer: Self-pay | Admitting: Internal Medicine

## 2021-09-07 DIAGNOSIS — I351 Nonrheumatic aortic (valve) insufficiency: Secondary | ICD-10-CM

## 2021-09-07 DIAGNOSIS — R030 Elevated blood-pressure reading, without diagnosis of hypertension: Secondary | ICD-10-CM

## 2021-09-07 DIAGNOSIS — R002 Palpitations: Secondary | ICD-10-CM | POA: Diagnosis not present

## 2021-09-07 DIAGNOSIS — R55 Syncope and collapse: Secondary | ICD-10-CM

## 2021-09-07 DIAGNOSIS — I350 Nonrheumatic aortic (valve) stenosis: Secondary | ICD-10-CM | POA: Diagnosis not present

## 2021-09-07 LAB — URINALYSIS, ROUTINE W REFLEX MICROSCOPIC
Bilirubin Urine: NEGATIVE
Glucose, UA: NEGATIVE mg/dL
Hgb urine dipstick: NEGATIVE
Ketones, ur: NEGATIVE mg/dL
Nitrite: NEGATIVE
Protein, ur: NEGATIVE mg/dL
Specific Gravity, Urine: 1.013 (ref 1.005–1.030)
pH: 5 (ref 5.0–8.0)

## 2021-09-07 LAB — ECHOCARDIOGRAM COMPLETE
AR max vel: 0.62 cm2
AV Area VTI: 0.63 cm2
AV Area mean vel: 0.55 cm2
AV Mean grad: 55.8 mmHg
AV Peak grad: 92 mmHg
Ao pk vel: 4.8 m/s
Area-P 1/2: 4.96 cm2
Height: 60 in
P 1/2 time: 228 msec
Radius: 0.3 cm
S' Lateral: 2.9 cm
Weight: 1808 oz

## 2021-09-07 LAB — CREATININE, SERUM
Creatinine, Ser: 1.29 mg/dL — ABNORMAL HIGH (ref 0.44–1.00)
GFR, Estimated: 41 mL/min — ABNORMAL LOW (ref >60)

## 2021-09-07 LAB — CBC
HCT: 35.8 % — ABNORMAL LOW (ref 36.0–46.0)
Hemoglobin: 11.3 g/dL — ABNORMAL LOW (ref 12.0–15.0)
MCH: 28.5 pg (ref 26.0–34.0)
MCHC: 31.6 g/dL (ref 30.0–36.0)
MCV: 90.2 fL (ref 80.0–100.0)
Platelets: 234 10*3/uL (ref 150–400)
RBC: 3.97 MIL/uL (ref 3.87–5.11)
RDW: 13.9 % (ref 11.5–15.5)
WBC: 7.1 10*3/uL (ref 4.0–10.5)
nRBC: 0 % (ref 0.0–0.2)

## 2021-09-07 LAB — RESP PANEL BY RT-PCR (FLU A&B, COVID) ARPGX2
Influenza A by PCR: NEGATIVE
Influenza B by PCR: NEGATIVE
SARS Coronavirus 2 by RT PCR: NEGATIVE

## 2021-09-07 LAB — TROPONIN I (HIGH SENSITIVITY)
Troponin I (High Sensitivity): 74 ng/L — ABNORMAL HIGH (ref <18)
Troponin I (High Sensitivity): 81 ng/L — ABNORMAL HIGH (ref <18)

## 2021-09-07 MED ORDER — ACETAMINOPHEN 650 MG RE SUPP: 650.0000 mg | Freq: Four times a day (QID) | RECTAL | Status: DC | PRN

## 2021-09-07 MED ORDER — ATORVASTATIN CALCIUM 10 MG PO TABS
10.0000 mg | ORAL_TABLET | Freq: Every day | ORAL | Status: DC
  Administered 2021-09-07 – 2021-09-08 (×2): 10 mg via ORAL
  Filled 2021-09-07 (×2): qty 1

## 2021-09-07 MED ORDER — ALPRAZOLAM 0.25 MG PO TABS
0.1250 mg | ORAL_TABLET | Freq: Every evening | ORAL | Status: DC | PRN
  Administered 2021-09-07: 0.125 mg via ORAL
  Filled 2021-09-07: qty 1

## 2021-09-07 MED ORDER — LATANOPROST 0.005 % OP SOLN
1.0000 [drp] | Freq: Every day | OPHTHALMIC | Status: DC
  Administered 2021-09-07: 1 [drp] via OPHTHALMIC
  Filled 2021-09-07 (×2): qty 2.5

## 2021-09-07 MED ORDER — DORZOLAMIDE HCL-TIMOLOL MAL 2-0.5 % OP SOLN
1.0000 [drp] | Freq: Every day | OPHTHALMIC | Status: DC
  Filled 2021-09-07: qty 10

## 2021-09-07 MED ORDER — SODIUM CHLORIDE 0.9% FLUSH
3.0000 mL | Freq: Two times a day (BID) | INTRAVENOUS | Status: DC
  Administered 2021-09-07 – 2021-09-08 (×3): 3 mL via INTRAVENOUS

## 2021-09-07 MED ORDER — LEVOTHYROXINE SODIUM 88 MCG PO TABS
88.0000 ug | ORAL_TABLET | Freq: Every day | ORAL | Status: DC
  Administered 2021-09-07 – 2021-09-08 (×2): 88 ug via ORAL
  Filled 2021-09-07 (×2): qty 1

## 2021-09-07 MED ORDER — SODIUM CHLORIDE 0.9 % IV SOLN: Freq: Once | INTRAVENOUS | Status: AC

## 2021-09-07 MED ORDER — ADULT MULTIVITAMIN W/MINERALS CH
1.0000 | ORAL_TABLET | Freq: Every day | ORAL | Status: DC
  Administered 2021-09-08: 1 via ORAL
  Filled 2021-09-07 (×2): qty 1

## 2021-09-07 MED ORDER — HEPARIN SODIUM (PORCINE) 5000 UNIT/ML IJ SOLN: 5000.0000 [IU] | Freq: Three times a day (TID) | INTRAMUSCULAR | Status: DC

## 2021-09-07 MED ORDER — ACETAMINOPHEN 325 MG PO TABS: 650.0000 mg | ORAL_TABLET | Freq: Four times a day (QID) | ORAL | Status: DC | PRN

## 2021-09-07 NOTE — ED Notes (Signed)
Patient denies pain and is resting comfortably.  

## 2021-09-07 NOTE — Plan of Care (Signed)

## 2021-09-07 NOTE — Consult Note (Addendum)
Cardiology Consultation:   Patient ID: Daneja Flammia MRN: [id number]; DOB: 1935-08-30  Admit date: 09/06/2021 Date of Consult: 09/07/2021  PCP:  [name], MD   [facility] Cardiologist:  New to [facility] - Dr. Debara Pickett     Patient Profile:   Khenadi Korch is a 87 y.o. female with a hx of hypertension (not on any antihypertensive meds), HLD, hypothyroidism and history of aortic regurgitation who is being seen 09/07/2021 for the evaluation of presyncope at the request of Dr. Marylyn Ishihara.  History of Present Illness:   Ms. Derman is a 87 year old female with past medical history of hypertension (not on any antihypertensive meds), HLD, hypothyroidism and history of aortic regurgitation.  Last echocardiogram obtained on 03/01/2017 showed EF 65 to 70%, mild LVH, grade 1 DD, moderate aortic regurgitation, mildly reduced RV systolic function.  Patient presented to [facility] ED with an episode of near syncope.  She says that this is the third time this has happened.  She had 2 other episodes, one was a week ago, one was 2 weeks ago.  During our interview, majority of the things the patient talk about is her anxiety and frustration in taking care of her demented husband.  Last night, after finished watching TV, she climbed up stairs to go to the bathroom when she suddenly feel weak and had cold sweat pouring down.  She denied any chest pain however feels like she was going to pass out.  That is why she allowed herself to lay on the floor.  She did not lose consciousness.  She asked her husband to call her son who contacted 911.  The episode was accompanied by tachycardia palpitation.  This is similar in presentation to the 2 previous episodes.  While in the emergency room, telemetry did not reveal any irregular rhythm.  EKG demonstrated T wave inversion in the inferolateral leads.  Serial troponin was mildly elevated at 74--81.  Creatinine mildly elevated 1.33.  Hemoglobin 11.0.  Urinalysis  showed trace leukocyte and rare bacteria.  Viral panel negative for influenza or COVID.  Chest x-ray normal.  Patient was admitted to hospitalist service for presyncope.  Noted, patient states she is DNR/DNI   Past Medical History:  Diagnosis Date   Glaucoma    High cholesterol    Hypertension    Thyroid disease     Past Surgical History:  Procedure Laterality Date   CESAREAN SECTION     x 2     Home Medications:  Prior to Admission medications   Medication Sig Start Date End Date Taking? Authorizing Provider  acetaminophen (TYLENOL) 500 MG tablet Take 500 mg by mouth daily as needed for headache.   Yes [provider]  ALPRAZolam (XANAX) 0.25 MG tablet Take 0.5 tablets by mouth at bedtime. For anxiety 06/01/12  Yes [provider]  atorvastatin (LIPITOR) 10 MG tablet Take 10 mg by mouth daily. 09/01/21  Yes [provider]  dorzolamide-timolol (COSOPT) 22.3-6.8 MG/ML ophthalmic solution Place 1 drop into the right eye at bedtime. 06/24/12  Yes [provider]  fluvoxaMINE (LUVOX) 100 MG tablet Take 200 tablets by mouth at bedtime. 06/18/12  Yes [provider]  latanoprost (XALATAN) 0.005 % ophthalmic solution Place 1 drop into both eyes at bedtime.  06/19/12  Yes [provider]  Multiple Vitamin (MULTIVITAMIN WITH MINERALS) TABS Take 1 tablet by mouth daily.   Yes [provider]  SYNTHROID 88 MCG tablet Take 88 mcg by mouth daily.  06/12/12  Yes  [provider]    Inpatient Medications: Scheduled Meds:  Continuous Infusions:  PRN Meds:   Allergies:   No Known Allergies  Social History:   Social History   Socioeconomic History   Marital status: Married    Spouse name: Not on file   Number of children: Not on file   Years of education: Not on file   Highest education level: Not on file  Occupational History   Not on file  Tobacco Use   Smoking status: Former    Types: Cigarettes   Smokeless  tobacco: Never   Tobacco comments:    Quit around 79  Substance and Sexual Activity   Alcohol use: Yes    Comment: rarely   Drug use: Never   Sexual activity: Not on file  Other Topics Concern   Not on file  Social History Narrative   Not on file   Social Determinants of Health   Financial Resource Strain: Not on file  Food Insecurity: Not on file  Transportation Needs: Not on file  Physical Activity: Not on file  Stress: Not on file  Social Connections: Not on file  Intimate Partner Violence: Not on file    Family History:   Family History  Problem Relation Age of Onset   Hyperlipidemia Mother    Cancer Father      ROS:  Please see the history of present illness.   All other ROS reviewed and negative.     Physical Exam/Data:   Vitals:   09/07/21 0530 09/07/21 0600 09/07/21 0630 09/07/21 0930  BP: (!) 181/65 (!) 168/68 (!) 173/78 (!) 173/77  Pulse: 71 65 66 83  Resp: 20 18 18 15   Temp:      TempSrc:      SpO2: 94% 95% 96% 97%  Weight:      Height:       No intake or output data in the 24 hours ending 09/07/21 0959 Last 3 Weights 09/06/2021 06/30/2012  Weight (lbs) 113 lb 147 lb  Weight (kg) 51.256 kg 66.679 kg     Body mass index is 22.07 kg/m.  General:  Well nourished, well developed, in no acute distress HEENT: normal Neck: no JVD Vascular: No carotid bruits; Distal pulses 2+ bilaterally Cardiac:  normal S1, S2; RRR; no murmur  Lungs:  clear to auscultation bilaterally, no wheezing, rhonchi or rales  Abd: soft, nontender, no hepatomegaly  Ext: no edema Musculoskeletal:  No deformities, BUE and BLE strength normal and equal Skin: warm and dry  Neuro:  CNs 2-12 intact, no focal abnormalities noted Psych:  Normal affect   EKG:  The EKG was personally reviewed and demonstrates: Normal sinus rhythm, T wave inversion in the inferolateral leads. Telemetry:  Telemetry was personally reviewed and demonstrates: Normal sinus rhythm, no significant  ventricular ectopy  Relevant CV Studies:  Echo 03/01/2017 LV EF: 65% -   70%   -------------------------------------------------------------------  Indications:      424.0 Mitral valve disease (I34.0).   -------------------------------------------------------------------  History:   PMH:   Murmur.  Risk factors:  Hypertension.   -------------------------------------------------------------------  Study Conclusions   - Left ventricle: The cavity size was normal. Wall thickness was    increased in a pattern of mild LVH. Systolic function was    vigorous. The estimated ejection fraction was in the range of 65%    to 70%. Wall motion was normal; there were no regional wall    motion abnormalities. Doppler parameters are consistent with  abnormal left ventricular relaxation (grade 1 diastolic    dysfunction).  - Aortic valve: There was moderate regurgitation. Valve area (VTI):    0.69 cm^2. Valve area (Vmax): 0.67 cm^2. Valve area (Vmean): 0.77    cm^2.  - Right ventricle: Systolic function was mildly reduced.   Laboratory Data:  High Sensitivity Troponin:   Recent Labs  Lab 09/06/21 2315 09/07/21 0115  TROPONINIHS 74* 81*     Chemistry Recent Labs  Lab 09/06/21 2246  NA 138  K 4.1  CL 102  CO2 26  GLUCOSE 118*  BUN 33*  CREATININE 1.33*  CALCIUM 9.3  GFRNONAA 39*  ANIONGAP 10    No results for input(s): PROT, ALBUMIN, AST, ALT, ALKPHOS, BILITOT in the last 168 hours. Lipids No results for input(s): CHOL, TRIG, HDL, LABVLDL, LDLCALC, CHOLHDL in the last 168 hours.  Hematology Recent Labs  Lab 09/06/21 2246  WBC 7.4  RBC 3.85*  HGB 11.0*  HCT 34.8*  MCV 90.4  MCH 28.6  MCHC 31.6  RDW 14.1  PLT 246   Thyroid No results for input(s): TSH, FREET4 in the last 168 hours.  BNPNo results for input(s): BNP, PROBNP in the last 168 hours.  DDimer No results for input(s): DDIMER in the last 168 hours.   Radiology/Studies:  DG Chest Portable 1 View  Result  Date: 09/06/2021 CLINICAL DATA:  Syncope. EXAM: PORTABLE CHEST 1 VIEW COMPARISON:  None. FINDINGS: No focal consolidation, pleural effusion or pneumothorax. Minimal left lung base atelectasis. Borderline cardiomegaly. Atherosclerotic calcification of the aorta. No acute osseous pathology. IMPRESSION: No active disease. Electronically Signed   By: Anner Crete M.D.   On: 09/06/2021 23:25     Assessment and Plan:   Presyncope  -This is the third time this has happened, the previous 2 episodes occurred a week apart, first episode was about 2 weeks ago.  Last episode was last night.  -Her symptom improved sudden onset of extreme weakness, cold sweat and tachycardia palpitation.  -Cardiac telemetry in the emergency room has not shown any irregular rhythm.  However her recurrence of the symptom is quite concerning for arrhythmia.  -Obtain echocardiogram.  Continue observation, likely will consider 2-week ZIO AT (with live monitoring feature) on discharge tomorrow.   Mildly elevated troponin:   -Serial troponin 74-->81  -EKG showed T wave inversion in the inferolateral leads, however patient denied any chest pain.  Will discuss with MD, may consider outpatient Myoview, however suspect troponin is more likely to be elevated either in the setting of palpitation versus blood pressure drop based on her presentation and symptoms.  Aortic regurgitation: repeat echocardiogram  Untreated hypertension: Her blood pressure has been elevated in the hospital around 160s.  Will discuss with MD, hesitant to aggressively treat the blood pressure as it likely helped her to avoid passing out during last night's event.  Hyperlipidemia  Hypothyroidism   Risk Assessment/Risk Scores:        For questions or updates, please contact [place] Please consult [email or web address] for contact info under    Hilbert Corrigan, [place]  09/07/2021 9:59 AM

## 2021-09-07 NOTE — Progress Notes (Signed)
Discussed case with Dr. Armandina Gemma. 62F with HTN, hypothyroidism, HLD, moderate AR and glaucoma who presents with nausea and near syncopal episode after bending over. No CP/pressure, SOB, DOE, orthopnea, PND or LE edema. Did have palpitations and diaphoresis around event. No head trauma or true LOC. No FNDs on arrival to the ED. Evaluated at [facility] ED. VS initially with P 68, BP 162/62, RR 16, T 97.4. hsT (74). ECG reviewed (09/06/21, 22:48:43) with NSR 69, PR 180, QRS 86, Qtc 441, artifact throughout, TWI V4-V5, otherwise no signs of conduction disease or dynamic ischemic changes. Patient asx during our discussion. Repeat hsT pending at the time. If uptrending planned to transfer to [facility], if flat planned to keep at [facility] for echo and medicine observation. Repeat hsT (81). TTE and observation. Reviewed prior TTE, moderate AR but no AS so less likely that progression of valvular disease caused her syncopal episode. Would be worth considering an OP Holter monitor if palpitations were leading up to event.   Prior TTE Result date: 03/01/17\ - Left ventricle: The cavity size was normal. Wall thickness was    increased in a pattern of mild LVH. Systolic function was    vigorous. The estimated ejection fraction was in the range of 65%    to 70%. Wall motion was normal; there were no regional wall    motion abnormalities. Doppler parameters are consistent with    abnormal left ventricular relaxation (grade 1 diastolic    dysfunction).  - Aortic valve: There was moderate regurgitation. Valve area (VTI):    0.69 cm^2. Valve area (Vmax): 0.67 cm^2. Valve area (Vmean): 0.77    cm^2.  - Right ventricle: Systolic function was mildly reduced.

## 2021-09-07 NOTE — Progress Notes (Signed)
°  Echocardiogram 2D Echocardiogram has been performed.  Sharon Campbell 09/07/2021, 12:18 PM

## 2021-09-07 NOTE — ED Notes (Signed)
Echo at bedside

## 2021-09-07 NOTE — H&P (Signed)
History and Physical    Sharon Campbell LOV:[id number] DOB: 1934/09/20 DOA: 09/06/2021  PCP: Merlene Laughter, MD  Patient coming from: Home  Chief Complaint: near syncope  HPI: Sharon Campbell is a 87 y.o. female with medical history significant of anxiety, HLD, HTN, hypothyroidism. Presenting with a near syncopal episode. She was in her normal state of health until last night. She was getting dressed for bed and felt diaphoretic. She felt her hear racing. She became weak and fell to the ground. She did not hit her head. She felt as if she were going to faint, but she did not completely go out. She was unable to get up on her own. She called for family. They called for EMS.   ED Course: She was noted to have elevated trp. Cardiology was consulted. [facility] was called for admission.   Review of Systems:  Denies CP, dyspnea, abdominal pain, N/V/D, fevers, sick contacts. Review of systems is otherwise negative for all not mentioned in HPI.   PMHx Past Medical History:  Diagnosis Date   Glaucoma    High cholesterol    Hypertension    Thyroid disease     PSHx History reviewed. No pertinent surgical history.  SocHx  reports that she has never smoked. She does not have any smokeless tobacco history on file. No history on file for alcohol use and drug use.  No Known Allergies  FamHx Reviewed, non-contributory.  Prior to Admission medications   Medication Sig Start Date End Date Taking? Authorizing Provider  acetaminophen (TYLENOL) 500 MG tablet Take 500 mg by mouth daily as needed for headache.   Yes [provider]  ALPRAZolam (XANAX) 0.25 MG tablet Take 0.5 tablets by mouth at bedtime. For anxiety 06/01/12  Yes [provider]  atorvastatin (LIPITOR) 10 MG tablet Take 10 mg by mouth daily. 09/01/21  Yes [provider]  dorzolamide-timolol (COSOPT) 22.3-6.8 MG/ML ophthalmic solution Place 1 drop into the right eye at bedtime. 06/24/12  Yes [provider]  fluvoxaMINE (LUVOX) 100 MG tablet Take 200 tablets by mouth at bedtime. 06/18/12  Yes [provider]  latanoprost (XALATAN) 0.005 % ophthalmic solution Place 1 drop into both eyes at bedtime.  06/19/12  Yes [provider]  Multiple Vitamin (MULTIVITAMIN WITH MINERALS) TABS Take 1 tablet by mouth daily.   Yes [provider]  SYNTHROID 88 MCG tablet Take 88 mcg by mouth daily.  06/12/12  Yes [provider]    Physical Exam: Vitals:   09/07/21 0300 09/07/21 0530 09/07/21 0600 09/07/21 0630  BP: (!) 168/73 (!) 181/65 (!) 168/68 (!) 173/78  Pulse: 68 71 65 66  Resp: 17 20 18 18   Temp:      TempSrc:      SpO2: 96% 94% 95% 96%  Weight:      Height:        General: 87 y.o. female resting in bed in NAD Eyes: PERRL, normal sclera ENMT: Nares patent w/o discharge, orophaynx clear, dentition normal, ears w/o discharge/lesions/ulcers Neck: Supple, trachea midline Cardiovascular: RRR, +S1, S2, no g/r, 2/6 SEM, equal pulses throughout Respiratory: CTABL, no w/r/r, normal WOB GI: BS+, NDNT, no masses noted, no organomegaly noted MSK: No e/c/c Skin: No rashes, bruises, ulcerations noted Neuro: A&O x 3, no focal deficits Psyc: Appropriate interaction and affect, calm/cooperative  Labs on Admission: I have personally reviewed following labs and imaging studies  CBC: Recent Labs  Lab 09/06/21 2246  WBC 7.4  HGB 11.0*  HCT 34.8*  MCV 90.4  PLT 246   Basic Metabolic Panel: Recent Labs  Lab 09/06/21 2246  NA 138  K 4.1  CL 102  CO2 26  GLUCOSE 118*  BUN 33*  CREATININE 1.33*  CALCIUM 9.3   GFR: Estimated Creatinine Clearance: 22.6 mL/min (A) (by C-G formula based on SCr of 1.33 mg/dL (H)). Liver Function Tests: No results for input(s): AST, ALT, ALKPHOS, BILITOT, PROT, ALBUMIN in the last 168 hours. No results for input(s): LIPASE, AMYLASE in the last 168 hours. No results for input(s): AMMONIA in the last 168  hours. Coagulation Profile: No results for input(s): INR, PROTIME in the last 168 hours. Cardiac Enzymes: No results for input(s): CKTOTAL, CKMB, CKMBINDEX, TROPONINI in the last 168 hours. BNP (last 3 results) No results for input(s): PROBNP in the last 8760 hours. HbA1C: No results for input(s): HGBA1C in the last 72 hours. CBG: Recent Labs  Lab 09/06/21 2247  GLUCAP 106*   Lipid Profile: No results for input(s): CHOL, HDL, LDLCALC, TRIG, CHOLHDL, LDLDIRECT in the last 72 hours. Thyroid Function Tests: No results for input(s): TSH, T4TOTAL, FREET4, T3FREE, THYROIDAB in the last 72 hours. Anemia Panel: No results for input(s): VITAMINB12, FOLATE, FERRITIN, TIBC, IRON, RETICCTPCT in the last 72 hours. Urine analysis:    Component Value Date/Time   COLORURINE YELLOW 09/06/2021 0614   APPEARANCEUR CLEAR 09/06/2021 0614   LABSPEC 1.013 09/06/2021 0614   PHURINE 5.0 09/06/2021 0614   GLUCOSEU NEGATIVE 09/06/2021 0614   HGBUR NEGATIVE 09/06/2021 0614   BILIRUBINUR NEGATIVE 09/06/2021 0614   KETONESUR NEGATIVE 09/06/2021 0614   PROTEINUR NEGATIVE 09/06/2021 0614   NITRITE NEGATIVE 09/06/2021 0614   LEUKOCYTESUR TRACE (A) 09/06/2021 0614    Radiological Exams on Admission: DG Chest Portable 1 View  Result Date: 09/06/2021 CLINICAL DATA:  Syncope. EXAM: PORTABLE CHEST 1 VIEW COMPARISON:  None. FINDINGS: No focal consolidation, pleural effusion or pneumothorax. Minimal left lung base atelectasis. Borderline cardiomegaly. Atherosclerotic calcification of the aorta. No acute osseous pathology. IMPRESSION: No active disease. Electronically Signed   By: Elgie Collard M.D.   On: 09/06/2021 23:25    EKG: Independently reviewed. Sinus, no st elevation  Assessment/Plan Near syncope Elevated troponins     - placed in obs, tele     - cards consulted, appreciate assistance     - check echo, trend trp     - check orthostatics     - no chest pain     - trp 74 -> 81  HTN     - not  on meds at home     - will have PRNs for now  HLD     - continue statin  Anxiety     - continue home regimen  Hypothyroidism     - continue home regimen  AKI     - fluids, renal US, watch nephrotoxins, follow  DVT prophylaxis: heparin Code Status: DNR Family Communication: None at bedside  Consults called: Cardiology   Status is: Observation  The patient remains OBS appropriate and will d/c before 2 midnights.  Teddy Spike DO [facility]  If 7PM-7AM, please contact night-coverage [email or web address]  09/07/2021, 7:16 AM

## 2021-09-08 DIAGNOSIS — I351 Nonrheumatic aortic (valve) insufficiency: Secondary | ICD-10-CM | POA: Diagnosis not present

## 2021-09-08 DIAGNOSIS — E039 Hypothyroidism, unspecified: Secondary | ICD-10-CM

## 2021-09-08 DIAGNOSIS — I350 Nonrheumatic aortic (valve) stenosis: Secondary | ICD-10-CM | POA: Diagnosis not present

## 2021-09-08 DIAGNOSIS — R778 Other specified abnormalities of plasma proteins: Secondary | ICD-10-CM

## 2021-09-08 DIAGNOSIS — R55 Syncope and collapse: Secondary | ICD-10-CM | POA: Diagnosis not present

## 2021-09-08 DIAGNOSIS — N1832 Chronic kidney disease, stage 3b: Secondary | ICD-10-CM | POA: Diagnosis not present

## 2021-09-08 DIAGNOSIS — I10 Essential (primary) hypertension: Secondary | ICD-10-CM

## 2021-09-08 DIAGNOSIS — R002 Palpitations: Secondary | ICD-10-CM | POA: Diagnosis not present

## 2021-09-08 LAB — COMPREHENSIVE METABOLIC PANEL
ALT: 20 U/L (ref 0–44)
AST: 27 U/L (ref 15–41)
Albumin: 3.3 g/dL — ABNORMAL LOW (ref 3.5–5.0)
Alkaline Phosphatase: 55 U/L (ref 38–126)
Anion gap: 9 (ref 5–15)
BUN: 27 mg/dL — ABNORMAL HIGH (ref 8–23)
CO2: 23 mmol/L (ref 22–32)
Calcium: 9.3 mg/dL (ref 8.9–10.3)
Chloride: 107 mmol/L (ref 98–111)
Creatinine, Ser: 1.23 mg/dL — ABNORMAL HIGH (ref 0.44–1.00)
GFR, Estimated: 43 mL/min — ABNORMAL LOW (ref >60)
Glucose, Bld: 91 mg/dL (ref 70–99)
Potassium: 3.7 mmol/L (ref 3.5–5.1)
Sodium: 139 mmol/L (ref 135–145)
Total Bilirubin: 0.6 mg/dL (ref 0.3–1.2)
Total Protein: 6.8 g/dL (ref 6.5–8.1)

## 2021-09-08 LAB — CBC
HCT: 36.2 % (ref 36.0–46.0)
Hemoglobin: 11.4 g/dL — ABNORMAL LOW (ref 12.0–15.0)
MCH: 28.9 pg (ref 26.0–34.0)
MCHC: 31.5 g/dL (ref 30.0–36.0)
MCV: 91.6 fL (ref 80.0–100.0)
Platelets: 240 10*3/uL (ref 150–400)
RBC: 3.95 MIL/uL (ref 3.87–5.11)
RDW: 14 % (ref 11.5–15.5)
WBC: 8.3 10*3/uL (ref 4.0–10.5)
nRBC: 0 % (ref 0.0–0.2)

## 2021-09-08 LAB — GLUCOSE, CAPILLARY: Glucose-Capillary: 96 mg/dL (ref 70–99)

## 2021-09-08 MED ORDER — FLUVOXAMINE MALEATE 100 MG PO TABS: 200.0000 mg | ORAL_TABLET | Freq: Every day | ORAL | Status: AC

## 2021-09-08 NOTE — Hospital Course (Signed)
87 y.o. female with medical history significant of anxiety, HLD, HTN, hypothyroidism. Presenting with a near syncopal episode. Near syncope.  Troponin 74 > 81.  Cardiology recommended admission for telemetry monitoring and repeat echo.  1/8 Pt arrived via EMS from home. Pt has been feeling nauseous throughout the day. Pt had a syncopal episode while bending over and hit her head on the padded carpet. Pt denies blood thinners. Pt denies head, neck, or back pain.   87 year old female with a medical history significant for hypertension, thyroid disease, hyperlipidemia, echocardiogram in 2018 which revealed moderate aortic regurgitation who presents to the emergency department with a near syncopal episode.  The patient states that she was under some stress because her husband likely has undiagnosed dementia and has been causing her increased stress at home.  She felt stressed and Felt nauseated earlier today.  She felt diaphoretic and bent over and had a episode of heart palpitations and near syncope.  She states that she went down to the ground, did not hit her head or lose consciousness.   Patient's initial troponin was elevated to 74.  I spoke with on-call cardiology who did recommend admission for observation on cardiac telemetry and repeat echocardiogram to given her history of aortic dilatation and near syncopal episode earlier tonight.  A repeat troponin was found to be flat at 81.  I do not think the patient needs transfer for admission to the cardiology service at this time after discussion with cardiology with flat troponins.    Lacrystal Ursery is a 87 y.o. female with medical history significant of anxiety, HLD, HTN, hypothyroidism. Presenting with a near syncopal episode. She was in her normal state of health until last night. She was getting dressed for bed and felt diaphoretic. She felt her hear racing. She became weak and fell to the ground. She did not hit her head. She felt as if she were  going to faint, but she did not completely go out. She was unable to get up on her own. She called for family. They called for EMS.   Near syncope Elevated troponins     - placed in obs, tele     - cards consulted, appreciate assistance     - check echo, trend trp     - check orthostatics     - no chest pain     - trp 74 -> 81   HTN     - not on meds at home     - will have PRNs for now   HLD     - continue statin   Anxiety     - continue home regimen   Hypothyroidism     - continue home regimen   AKI vs CKD stage IV, f/u as outpt, no baseline in system or CE     - fluids, renal US, watch nephrotoxins, follow  This is a pleasant 87 year old female with a history of presyncope and aortic regurgitation as well as untreated hypertension.  She had 3 episodes of near syncope and had mild elevation of troponin.  No chest pain.  Suspect arrhythmia.  We will repeat an echo to reassess her aortic insufficiency and for any wall motion abnormalities.  Agree with plans for 2-week outpatient monitor.  Ultimately she may need ischemic evaluation.  Given recurrent bradycardia, we may need to be cautious about treating her hypertension unless is significantly elevated.  She does have a wide pulse pressure associated with aortic insufficiency.

## 2021-09-08 NOTE — Progress Notes (Signed)
Progress Note  Patient Name: Sharon Campbell Date of Encounter: 09/08/2021  [facility] Cardiologist: Pixie Casino, MD   Subjective   No complaints, wants to discharge home.  Inpatient Medications    Scheduled Meds:  atorvastatin  10 mg Oral Daily   dorzolamide-timolol  1 drop Right Eye QHS   heparin  5,000 Units Subcutaneous Q8H   latanoprost  1 drop Both Eyes QHS   levothyroxine  88 mcg Oral Q0600   multivitamin with minerals  1 tablet Oral Daily   sodium chloride flush  3 mL Intravenous Q12H   Continuous Infusions:  PRN Meds: acetaminophen **OR** acetaminophen, ALPRAZolam   Vital Signs    Vitals:   09/07/21 1947 09/07/21 2314 09/08/21 0421 09/08/21 0500  BP: (!) 146/89 (!) 165/65 (!) 169/83   Pulse: 85 74 84   Resp: 20 20 20    Temp: 97.6 F (36.4 C) 98 F (36.7 C) 98.5 F (36.9 C)   TempSrc: Oral Oral    SpO2: 98% 98% 95%   Weight:    49.3 kg  Height:        Intake/Output Summary (Last 24 hours) at 09/08/2021 1257 Last data filed at 09/08/2021 0900 Gross per 24 hour  Intake 1171.25 ml  Output --  Net 1171.25 ml   Last 3 Weights 09/08/2021 09/07/2021 09/06/2021  Weight (lbs) 108 lb 9.6 oz 109 lb 2 oz 113 lb  Weight (kg) 49.261 kg 49.5 kg 51.256 kg      Telemetry    Sinus rhythm - ST with HR 80-100s - Personally Reviewed  ECG    No new tracings - Personally Reviewed  Physical Exam   GEN: No acute distress.   Neck: No JVD Cardiac: RRR, 5/6 holosystolic murmur, no S2 appreciated Respiratory: Clear to auscultation bilaterally. GI: Soft, nontender, non-distended  MS: No edema; No deformity. Neuro:  Nonfocal  Psych: Normal affect   Labs    High Sensitivity Troponin:   Recent Labs  Lab 09/06/21 2315 09/07/21 0115  TROPONINIHS 74* 81*     Chemistry Recent Labs  Lab 09/06/21 2246 09/07/21 1202 09/08/21 0417  NA 138  --  139  K 4.1  --  3.7  CL 102  --  107  CO2 26  --  23  GLUCOSE 118*  --  91  BUN 33*  --  27*  CREATININE  1.33* 1.29* 1.23*  CALCIUM 9.3  --  9.3  PROT  --   --  6.8  ALBUMIN  --   --  3.3*  AST  --   --  27  ALT  --   --  20  ALKPHOS  --   --  55  BILITOT  --   --  0.6  GFRNONAA 39* 41* 43*  ANIONGAP 10  --  9    Lipids No results for input(s): CHOL, TRIG, HDL, LABVLDL, LDLCALC, CHOLHDL in the last 168 hours.  Hematology Recent Labs  Lab 09/06/21 2246 09/07/21 1202 09/08/21 0417  WBC 7.4 7.1 8.3  RBC 3.85* 3.97 3.95  HGB 11.0* 11.3* 11.4*  HCT 34.8* 35.8* 36.2  MCV 90.4 90.2 91.6  MCH 28.6 28.5 28.9  MCHC 31.6 31.6 31.5  RDW 14.1 13.9 14.0  PLT 246 234 240   Thyroid No results for input(s): TSH, FREET4 in the last 168 hours.  BNPNo results for input(s): BNP, PROBNP in the last 168 hours.  DDimer No results for input(s): DDIMER in the last 168 hours.  Radiology    DG Chest Portable 1 View  Result Date: 09/06/2021 CLINICAL DATA:  Syncope. EXAM: PORTABLE CHEST 1 VIEW COMPARISON:  None. FINDINGS: No focal consolidation, pleural effusion or pneumothorax. Minimal left lung base atelectasis. Borderline cardiomegaly. Atherosclerotic calcification of the aorta. No acute osseous pathology. IMPRESSION: No active disease. Electronically Signed   By: Anner Crete M.D.   On: 09/06/2021 23:25   ECHOCARDIOGRAM COMPLETE  Result Date: 09/07/2021    ECHOCARDIOGRAM REPORT   Patient Name:   Sharon Campbell Date of Exam: 09/07/2021 Medical Rec #:  [id number]           Height:       60.0 in Accession #:    [id number]          Weight:       113.0 lb Date of Birth:  09/10/1934           BSA:          1.464 m Patient Age:    34 years            BP:           173/77 mmHg Patient Gender: F                   HR:           86 bpm. Exam Location:  Inpatient Procedure: 2D Echo, Cardiac Doppler and Color Doppler Indications:    R55 Syncope  History:        Patient has prior history of Echocardiogram examinations, most                 recent 03/01/2017. Risk Factors:Hypertension.  Sonographer:    Bernadene Person  RDCS Referring Phys: [id number] [place]  1. Left ventricular ejection fraction, by estimation, is 60 to 65%. The left ventricle has normal function. The left ventricle has no regional wall motion abnormalities. There is moderate asymmetric left ventricular hypertrophy of the basal-septal segment. Left ventricular diastolic parameters are consistent with Grade II diastolic dysfunction (pseudonormalization). Elevated left atrial pressure.  2. Right ventricular systolic function is normal. The right ventricular size is normal. There is mildly elevated pulmonary artery systolic pressure. The estimated right ventricular systolic pressure is 123XX123 mmHg.  3. Left atrial size was moderately dilated.  4. The mitral valve is normal in structure. Mild to moderate mitral valve regurgitation.  5. The inferior vena cava is normal in size with greater than 50% respiratory variability, suggesting right atrial pressure of 3 mmHg.  6. The aortic valve was not well visualized. There is severe calcifcation of the aortic valve. Aortic valve regurgitation is moderate. Severe aortic valve stenosis. Vmax 4.9 m/s, MG 20mmHg, AVA 0.6 cm^2, DI 0.20 FINDINGS  Left Ventricle: Left ventricular ejection fraction, by estimation, is 60 to 65%. The left ventricle has normal function. The left ventricle has no regional wall motion abnormalities. The left ventricular internal cavity size was normal in size. There is  moderate asymmetric left ventricular hypertrophy of the basal-septal segment. Left ventricular diastolic parameters are consistent with Grade II diastolic dysfunction (pseudonormalization). Elevated left atrial pressure. Right Ventricle: The right ventricular size is normal. No increase in right ventricular wall thickness. Right ventricular systolic function is normal. There is mildly elevated pulmonary artery systolic pressure. The tricuspid regurgitant velocity is 3.00  m/s, and with an assumed right atrial pressure of 3  mmHg, the estimated right ventricular systolic pressure is 123XX123 mmHg. Left Atrium: Left atrial size was  moderately dilated. Right Atrium: Right atrial size was normal in size. Pericardium: There is no evidence of pericardial effusion. Mitral Valve: The mitral valve is normal in structure. Mild to moderate mitral valve regurgitation. Tricuspid Valve: The tricuspid valve is normal in structure. Tricuspid valve regurgitation is trivial. Aortic Valve: The aortic valve was not well visualized. There is severe calcifcation of the aortic valve. Aortic valve regurgitation is moderate. Aortic regurgitation PHT measures 228 msec. Severe aortic stenosis is present. Aortic valve mean gradient measures 55.8 mmHg. Aortic valve peak gradient measures 92.0 mmHg. Aortic valve area, by VTI measures 0.63 cm. Pulmonic Valve: The pulmonic valve was not well visualized. Pulmonic valve regurgitation is not visualized. Aorta: The aortic root and ascending aorta are structurally normal, with no evidence of dilitation. Venous: The inferior vena cava is normal in size with greater than 50% respiratory variability, suggesting right atrial pressure of 3 mmHg. IAS/Shunts: The interatrial septum was not well visualized.  LEFT VENTRICLE PLAX 2D LVIDd:         4.40 cm   Diastology LVIDs:         2.90 cm   LV e' medial:    3.08 cm/s LV PW:         1.10 cm   LV E/e' medial:  23.3 LV IVS:        1.30 cm   LV e' lateral:   4.35 cm/s LVOT diam:     2.00 cm   LV E/e' lateral: 16.5 LV SV:         65 LV SV Index:   44 LVOT Area:     3.14 cm  RIGHT VENTRICLE RV S prime:     14.70 cm/s TAPSE (M-mode): 1.6 cm LEFT ATRIUM             Index        RIGHT ATRIUM          Index LA diam:        4.20 cm 2.87 cm/m   RA Area:     9.97 cm LA Vol (A2C):   60.1 ml 41.04 ml/m  RA Volume:   20.40 ml 13.93 ml/m LA Vol (A4C):   66.2 ml 45.20 ml/m LA Biplane Vol: 66.6 ml 45.48 ml/m  AORTIC VALVE AV Area (Vmax):    0.62 cm AV Area (Vmean):   0.55 cm AV Area (VTI):      0.63 cm AV Vmax:           479.60 cm/s AV Vmean:          354.800 cm/s AV VTI:            1.032 m AV Peak Grad:      92.0 mmHg AV Mean Grad:      55.8 mmHg LVOT Vmax:         94.10 cm/s LVOT Vmean:        61.800 cm/s LVOT VTI:          0.207 m LVOT/AV VTI ratio: 0.20 AI PHT:            228 msec  AORTA Ao Root diam: 2.80 cm Ao Asc diam:  2.60 cm MITRAL VALVE                TRICUSPID VALVE MV Area (PHT): 4.96 cm     TR Peak grad:   36.0 mmHg MV Decel Time: 153 msec     TR Vmax:        300.00  cm/s MR PISA:        0.57 cm MR PISA Radius: 0.30 cm     SHUNTS MV E velocity: 71.80 cm/s   Systemic VTI:  0.21 m MV A velocity: 122.00 cm/s  Systemic Diam: 2.00 cm MV E/A ratio:  0.59 Oswaldo Milian MD Electronically signed by Oswaldo Milian MD Signature Date/Time: 09/07/2021/3:49:07 PM    Final     Cardiac Studies   Echo 09/07/21: 1. Left ventricular ejection fraction, by estimation, is 60 to 65%. The  left ventricle has normal function. The left ventricle has no regional  wall motion abnormalities. There is moderate asymmetric left ventricular  hypertrophy of the basal-septal  segment. Left ventricular diastolic parameters are consistent with Grade  II diastolic dysfunction (pseudonormalization). Elevated left atrial  pressure.   2. Right ventricular systolic function is normal. The right ventricular  size is normal. There is mildly elevated pulmonary artery systolic  pressure. The estimated right ventricular systolic pressure is 123XX123 mmHg.   3. Left atrial size was moderately dilated.   4. The mitral valve is normal in structure. Mild to moderate mitral valve  regurgitation.   5. The inferior vena cava is normal in size with greater than 50%  respiratory variability, suggesting right atrial pressure of 3 mmHg.   6. The aortic valve was not well visualized. There is severe calcifcation  of the aortic valve. Aortic valve regurgitation is moderate. Severe aortic  valve stenosis. Vmax 4.9 m/s, MG  55mmHg, AVA 0.6 cm^2, DI 0.20   Patient Profile     87 y.o. female with a hx of hypertension (not on any antihypertensive meds), HLD, hypothyroidism and history of aortic regurgitation who is being seen 09/07/2021 for the evaluation of presyncope.  Assessment & Plan    Severe AS Chronic diastolic dysfunction - echo yesterday with normal LVEF, RV fx, but grade 2 DD - severe aortic stenosis noted - question if this is the cause of her syncopal episodes  - I discussed consultation with the structural heart team   ?CKD stage II-III - sCr 1.23, from 1.33   HTN - BP in the 160s - will be conservative with her control given symptoms - start 2.5 mg amlodipine   She describes her husband with Alzheimer's disease. She states "it doesn't look good." When discussing consultation with the structural heart team, she states she probably won't have anything done, but would like to speak with the team about options and prognosis.      For questions or updates, please contact [place] Please consult [email or web address] for contact info under        Signed, Ledora Bottcher, PA  09/08/2021, 12:57 PM

## 2021-09-08 NOTE — Progress Notes (Signed)
This RN reviewed the patients discharge instructions with her. All questions addressed. IV removed, tele removed. RN called pt's son, Brett Canales, for transport. Pt leaving with Brett Canales in private vehicle in stable condition. No further needs.

## 2021-09-18 DIAGNOSIS — N1832 Chronic kidney disease, stage 3b: Secondary | ICD-10-CM | POA: Diagnosis not present

## 2021-09-18 DIAGNOSIS — I129 Hypertensive chronic kidney disease with stage 1 through stage 4 chronic kidney disease, or unspecified chronic kidney disease: Secondary | ICD-10-CM | POA: Diagnosis not present

## 2021-09-24 NOTE — Discharge Summary (Signed)
Physician Discharge Summary   Patient: Sharon Campbell MRN: [id number] DOB: [birth year]  Admit date:     09/06/2021  Discharge date: 09/08/2021  Discharge Physician: Brendia Sacks   PCP: Merlene Laughter, MD   Recommendations at discharge:   Presyncope, secondary to severe Aortic Stenosis  --consider outpatient cardiology (recommend considering TAVR, pt wished to defer to PCP)  Chronic diastolic CHF --stable, no diuretic indicated currently --cardiology rec only low dose amlodipine, deferred to outpatient setting  Renal insufficiency, suspect CKD stage IIIb --follow-up as an outpatient  Discharge Diagnoses Principal Problem:   Near syncope Severe Aortic Stenosis -- symptomatic Chronic diastolic CHF Renal insufficiency, suspect CKD stage IIIb Essential hypertension Elevated troponin Hypothyroidism    Hospital Course   87yow presented with possible near syncope, mildly elevated troponin. Cardiology recommended admission for telemetry monitoring and repeat echo.   Seen by cardiology, echo showed severe to critical aortic stenosis, which carries high risk of mortality >50% in 1 year. May bre a candidate for TAVR but after discussion with cardiology, she elected to follow-up first with her PCP. Cardiology planned to reach out to multidisciplinary valve clinic, but given patients reluctance to proceed, can follow-up as needed, as determined by her follow-up with her PCP.  Presyncope, secondary to severe Aortic Stenosis -- symptomatic -- as above  Chronic diastolic CHF --stable, no diuretic indicated currently --cardiology rec only low dose amlodipine, deferred to outpatient setting  Renal insufficiency, suspect CKD stage IIIb --follow-up as an outpatient  Essential hypertension --consider low-dose amlodipine  Elevated troponin --probably related to presyncope. No further evaluation suggested.  Hypothyroidism  --levothyroxine   Consultants: cardiology  Procedures  performed: none  Disposition: Home Diet recommendation: Regular diet  DISCHARGE MEDICATION: Allergies as of 09/08/2021   No Known Allergies      Medication List     TAKE these medications    acetaminophen 500 MG tablet Commonly known as: TYLENOL Take 500 mg by mouth daily as needed for headache.   ALPRAZolam 0.25 MG tablet Commonly known as: XANAX Take 0.5 tablets by mouth at bedtime. For anxiety   atorvastatin 10 MG tablet Commonly known as: LIPITOR Take 10 mg by mouth daily.   dorzolamide-timolol 22.3-6.8 MG/ML ophthalmic solution Commonly known as: COSOPT Place 1 drop into the right eye at bedtime.   fluvoxaMINE 100 MG tablet Commonly known as: LUVOX Take 2 tablets (200 mg total) by mouth at bedtime. What changed: how much to take   latanoprost 0.005 % ophthalmic solution Commonly known as: XALATAN Place 1 drop into both eyes at bedtime.   multivitamin with minerals Tabs tablet Take 1 tablet by mouth daily.   Synthroid 88 MCG tablet Generic drug: levothyroxine Take 88 mcg by mouth daily.        Follow-up Information     Merlene Laughter, MD Follow up on 09/11/2021.   Specialty: Internal Medicine Why: Keep already scheduled appointment Contact information: [address] [place] [address] [phone]         Chrystie Nose, MD .   Specialty: Cardiology Contact information: [address] [place] [address] [phone]                Feels fine, wants to go home.  Discharge Exam: Filed Weights   09/06/21 2247 09/07/21 1558 09/08/21 0500  Weight: 51.3 kg 49.5 kg 49.3 kg   Physical Exam Vitals reviewed.  Constitutional:      General: She is not in acute distress.  Appearance: She is not ill-appearing or toxic-appearing.  Cardiovascular:     Rate and Rhythm: Normal rate and regular rhythm.     Heart sounds: No murmur heard. Pulmonary:     Effort: Pulmonary effort is normal. No respiratory  distress.     Breath sounds: No wheezing, rhonchi or rales.  Neurological:     Mental Status: She is alert.  Psychiatric:        Mood and Affect: Mood normal.        Behavior: Behavior normal.   Condition at discharge: good  The results of significant diagnostics from this hospitalization (including imaging, microbiology, ancillary and laboratory) are listed below for reference.   Imaging Studies: DG Chest Portable 1 View  Result Date: 09/06/2021 CLINICAL DATA:  Syncope. EXAM: PORTABLE CHEST 1 VIEW COMPARISON:  None. FINDINGS: No focal consolidation, pleural effusion or pneumothorax. Minimal left lung base atelectasis. Borderline cardiomegaly. Atherosclerotic calcification of the aorta. No acute osseous pathology. IMPRESSION: No active disease. Electronically Signed   By: Anner Crete M.D.   On: 09/06/2021 23:25   ECHOCARDIOGRAM COMPLETE  Result Date: 09/07/2021    ECHOCARDIOGRAM REPORT   Patient Name:   Sharon Campbell Date of Exam: 09/07/2021 Medical Rec #:  [id number]           Height:       60.0 in Accession #:    [id number]          Weight:       113.0 lb Date of Birth:  [birth year]           BSA:          1.464 m Patient Age:    87 years            BP:           173/77 mmHg Patient Gender: F                   HR:           86 bpm. Exam Location:  Inpatient Procedure: 2D Echo, Cardiac Doppler and Color Doppler Indications:    R55 Syncope  History:        Patient has prior history of Echocardiogram examinations, most                 recent 03/01/2017. Risk Factors:Hypertension.  Sonographer:    Bernadene Person RDCS Referring Phys: [id number] [place]  1. Left ventricular ejection fraction, by estimation, is 60 to 65%. The left ventricle has normal function. The left ventricle has no regional wall motion abnormalities. There is moderate asymmetric left ventricular hypertrophy of the basal-septal segment. Left ventricular diastolic parameters are consistent with Grade II diastolic  dysfunction (pseudonormalization). Elevated left atrial pressure.  2. Right ventricular systolic function is normal. The right ventricular size is normal. There is mildly elevated pulmonary artery systolic pressure. The estimated right ventricular systolic pressure is 123XX123 mmHg.  3. Left atrial size was moderately dilated.  4. The mitral valve is normal in structure. Mild to moderate mitral valve regurgitation.  5. The inferior vena cava is normal in size with greater than 50% respiratory variability, suggesting right atrial pressure of 3 mmHg.  6. The aortic valve was not well visualized. There is severe calcifcation of the aortic valve. Aortic valve regurgitation is moderate. Severe aortic valve stenosis. Vmax 4.9 m/s, MG 21mmHg, AVA 0.6 cm^2, DI 0.20 FINDINGS  Left Ventricle: Left ventricular ejection fraction, by estimation, is 60 to 65%.  The left ventricle has normal function. The left ventricle has no regional wall motion abnormalities. The left ventricular internal cavity size was normal in size. There is  moderate asymmetric left ventricular hypertrophy of the basal-septal segment. Left ventricular diastolic parameters are consistent with Grade II diastolic dysfunction (pseudonormalization). Elevated left atrial pressure. Right Ventricle: The right ventricular size is normal. No increase in right ventricular wall thickness. Right ventricular systolic function is normal. There is mildly elevated pulmonary artery systolic pressure. The tricuspid regurgitant velocity is 3.00  m/s, and with an assumed right atrial pressure of 3 mmHg, the estimated right ventricular systolic pressure is 123XX123 mmHg. Left Atrium: Left atrial size was moderately dilated. Right Atrium: Right atrial size was normal in size. Pericardium: There is no evidence of pericardial effusion. Mitral Valve: The mitral valve is normal in structure. Mild to moderate mitral valve regurgitation. Tricuspid Valve: The tricuspid valve is normal in  structure. Tricuspid valve regurgitation is trivial. Aortic Valve: The aortic valve was not well visualized. There is severe calcifcation of the aortic valve. Aortic valve regurgitation is moderate. Aortic regurgitation PHT measures 228 msec. Severe aortic stenosis is present. Aortic valve mean gradient measures 55.8 mmHg. Aortic valve peak gradient measures 92.0 mmHg. Aortic valve area, by VTI measures 0.63 cm. Pulmonic Valve: The pulmonic valve was not well visualized. Pulmonic valve regurgitation is not visualized. Aorta: The aortic root and ascending aorta are structurally normal, with no evidence of dilitation. Venous: The inferior vena cava is normal in size with greater than 50% respiratory variability, suggesting right atrial pressure of 3 mmHg. IAS/Shunts: The interatrial septum was not well visualized.  LEFT VENTRICLE PLAX 2D LVIDd:         4.40 cm   Diastology LVIDs:         2.90 cm   LV e' medial:    3.08 cm/s LV PW:         1.10 cm   LV E/e' medial:  23.3 LV IVS:        1.30 cm   LV e' lateral:   4.35 cm/s LVOT diam:     2.00 cm   LV E/e' lateral: 16.5 LV SV:         65 LV SV Index:   44 LVOT Area:     3.14 cm  RIGHT VENTRICLE RV S prime:     14.70 cm/s TAPSE (M-mode): 1.6 cm LEFT ATRIUM             Index        RIGHT ATRIUM          Index LA diam:        4.20 cm 2.87 cm/m   RA Area:     9.97 cm LA Vol (A2C):   60.1 ml 41.04 ml/m  RA Volume:   20.40 ml 13.93 ml/m LA Vol (A4C):   66.2 ml 45.20 ml/m LA Biplane Vol: 66.6 ml 45.48 ml/m  AORTIC VALVE AV Area (Vmax):    0.62 cm AV Area (Vmean):   0.55 cm AV Area (VTI):     0.63 cm AV Vmax:           479.60 cm/s AV Vmean:          354.800 cm/s AV VTI:            1.032 m AV Peak Grad:      92.0 mmHg AV Mean Grad:      55.8 mmHg LVOT Vmax:  94.10 cm/s LVOT Vmean:        61.800 cm/s LVOT VTI:          0.207 m LVOT/AV VTI ratio: 0.20 AI PHT:            228 msec  AORTA Ao Root diam: 2.80 cm Ao Asc diam:  2.60 cm MITRAL VALVE                 TRICUSPID VALVE MV Area (PHT): 4.96 cm     TR Peak grad:   36.0 mmHg MV Decel Time: 153 msec     TR Vmax:        300.00 cm/s MR PISA:        0.57 cm MR PISA Radius: 0.30 cm     SHUNTS MV E velocity: 71.80 cm/s   Systemic VTI:  0.21 m MV A velocity: 122.00 cm/s  Systemic Diam: 2.00 cm MV E/A ratio:  0.59 Oswaldo Milian MD Electronically signed by Oswaldo Milian MD Signature Date/Time: 09/07/2021/3:49:07 PM    Final     Microbiology: Results for orders placed or performed during the hospital encounter of 09/06/21  Resp Panel by RT-PCR (Flu A&B, Covid) Nasopharyngeal Swab     Status: None   Collection Time: 09/07/21 12:26 AM   Specimen: Nasopharyngeal Swab; Nasopharyngeal(NP) swabs in vial transport medium  Result Value Ref Range Status   SARS Coronavirus 2 by RT PCR NEGATIVE NEGATIVE Final    Comment: (NOTE) SARS-CoV-2 target nucleic acids are NOT DETECTED.  The SARS-CoV-2 RNA is generally detectable in upper respiratory specimens during the acute phase of infection. The lowest concentration of SARS-CoV-2 viral copies this assay can detect is 138 copies/mL. A negative result does not preclude SARS-Cov-2 infection and should not be used as the sole basis for treatment or other patient management decisions. A negative result may occur with  improper specimen collection/handling, submission of specimen other than nasopharyngeal swab, presence of viral mutation(s) within the areas targeted by this assay, and inadequate number of viral copies(<138 copies/mL). A negative result must be combined with clinical observations, patient history, and epidemiological information. The expected result is Negative.  Fact Sheet for Patients:  EntrepreneurPulse.com.au  Fact Sheet for Healthcare Providers:  IncredibleEmployment.be  This test is no t yet approved or cleared by the [place] FDA and  has been authorized for detection and/or diagnosis of  SARS-CoV-2 by FDA under an Emergency Use Authorization (EUA). This EUA will remain  in effect (meaning this test can be used) for the duration of the COVID-19 declaration under Section 564(b)(1) of the Act, 21 U.S.C.section 360bbb-3(b)(1), unless the authorization is terminated  or revoked sooner.       Influenza A by PCR NEGATIVE NEGATIVE Final   Influenza B by PCR NEGATIVE NEGATIVE Final    Comment: (NOTE) The Xpert Xpress SARS-CoV-2/FLU/RSV plus assay is intended as an aid in the diagnosis of influenza from Nasopharyngeal swab specimens and should not be used as a sole basis for treatment. Nasal washings and aspirates are unacceptable for Xpert Xpress SARS-CoV-2/FLU/RSV testing.  Fact Sheet for Patients: EntrepreneurPulse.com.au  Fact Sheet for Healthcare Providers: IncredibleEmployment.be  This test is not yet approved or cleared by the [place] FDA and has been authorized for detection and/or diagnosis of SARS-CoV-2 by FDA under an Emergency Use Authorization (EUA). This EUA will remain in effect (meaning this test can be used) for the duration of the COVID-19 declaration under Section 564(b)(1) of the Act, 21 U.S.C. section 360bbb-3(b)(1), unless  the authorization is terminated or revoked.  Performed at [facility], [place] [address] [place], [place] [address]     Labs: CBC: No results for input(s): WBC, NEUTROABS, HGB, HCT, MCV, PLT in the last 168 hours. Basic Metabolic Panel: No results for input(s): NA, K, CL, CO2, GLUCOSE, BUN, CREATININE, CALCIUM, MG, PHOS in the last 168 hours. Liver Function Tests: No results for input(s): AST, ALT, ALKPHOS, BILITOT, PROT, ALBUMIN in the last 168 hours. CBG: No results for input(s): GLUCAP in the last 168 hours.  Discharge time spent: greater than 30 minutes.  Signed: Murray Hodgkins, MD [facility] 09/24/2021

## 2021-10-09 ENCOUNTER — Other Ambulatory Visit: Payer: Self-pay

## 2021-10-09 ENCOUNTER — Encounter: Payer: Self-pay | Admitting: Internal Medicine

## 2021-10-09 ENCOUNTER — Ambulatory Visit: Payer: Medicare Other | Admitting: Internal Medicine

## 2021-10-09 VITALS — BP 132/58 | HR 96 | Ht 60.0 in | Wt 112.4 lb

## 2021-10-09 DIAGNOSIS — I350 Nonrheumatic aortic (valve) stenosis: Secondary | ICD-10-CM | POA: Diagnosis not present

## 2021-10-09 DIAGNOSIS — I351 Nonrheumatic aortic (valve) insufficiency: Secondary | ICD-10-CM

## 2021-10-09 DIAGNOSIS — I10 Essential (primary) hypertension: Secondary | ICD-10-CM

## 2021-10-09 DIAGNOSIS — R55 Syncope and collapse: Secondary | ICD-10-CM

## 2021-10-09 DIAGNOSIS — I5032 Chronic diastolic (congestive) heart failure: Secondary | ICD-10-CM

## 2021-10-09 NOTE — Patient Instructions (Signed)
Medication Instructions:  Your physician recommends that you continue on your current medications as directed. Please refer to the Current Medication list given to you today.  *If you need a refill on your cardiac medications before your next appointment, please call your pharmacy*  Follow-Up: At [facility], you and your health needs are our priority.  As part of our continuing mission to provide you with exceptional heart care, we have created designated Provider Care Teams.  These Care Teams include your primary Cardiologist (physician) and Advanced Practice Providers (APPs -  Physician Assistants and Nurse Practitioners) who all work together to provide you with the care you need, when you need it.  We recommend signing up for the patient portal called "MyChart".  Sign up information is provided on this After Visit Summary.  MyChart is used to connect with patients for Virtual Visits (Telemedicine).  Patients are able to view lab/test results, encounter notes, upcoming appointments, etc.  Non-urgent messages can be sent to your provider as well.   To learn more about what you can do with MyChart, go to [email or web address].    Your next appointment:   3-4 month(s)  The format for your next appointment:   In Person  Provider:   Chrystie Nose, MD {    Other Instructions You have been referred to [facility] for aortic valve management

## 2021-10-09 NOTE — Progress Notes (Signed)
OFFICE NOTE  Chief Complaint:  Follow-up aortic stenosis  Primary Care Physician: Sharon Manes, MD  HPI:  Sharon Campbell is a 87 y.o. female with a past medial history significant for hypertension, dyslipidemia and recent hospitalization for presyncope.  She was recently hospitalized for presyncope.  Work-up at the time was undertaken for systolic and diastolic murmurs heard on exam.  She was found to have normal LVEF 60 to 65% with grade 2 diastolic dysfunction.  The aortic valve was severely stenotic with a mean gradient of 57 mmHg and dimensionless index of 0.2.  There was moderate aortic insufficiency as well.  I discussed this findings with her and at the time she was not ready to commit to surgical evaluation.  I recommended outpatient follow-up which was today.  She is accompanied by her son and they have discussed aortic valve replacement and she is agreeable to proceed with evaluation.  PMHx:  Past Medical History:  Diagnosis Date   Glaucoma    High cholesterol    Hypertension    Thyroid disease     Past Surgical History:  Procedure Laterality Date   CESAREAN SECTION     x 2    FAMHx:  Family History  Problem Relation Age of Onset   Hyperlipidemia Mother    Cancer Father     SOCHx:   reports that she has quit smoking. Her smoking use included cigarettes. She has never used smokeless tobacco. She reports current alcohol use. She reports that she does not use drugs.  ALLERGIES:  No Known Allergies  ROS: Pertinent items noted in HPI and remainder of comprehensive ROS otherwise negative.  HOME MEDS: Current Outpatient Medications on File Prior to Visit  Medication Sig Dispense Refill   acetaminophen (TYLENOL) 500 MG tablet Take 500 mg by mouth daily as needed for headache.     ALPRAZolam (XANAX) 0.25 MG tablet Take 0.5 tablets by mouth at bedtime. For anxiety     atorvastatin (LIPITOR) 10 MG tablet Take 10 mg by mouth daily.     dorzolamide-timolol  (COSOPT) 22.3-6.8 MG/ML ophthalmic solution Place 1 drop into the right eye at bedtime.     fluvoxaMINE (LUVOX) 100 MG tablet Take 2 tablets (200 mg total) by mouth at bedtime.     latanoprost (XALATAN) 0.005 % ophthalmic solution Place 1 drop into both eyes at bedtime.      Multiple Vitamin (MULTIVITAMIN WITH MINERALS) TABS Take 1 tablet by mouth daily.     SYNTHROID 88 MCG tablet Take 88 mcg by mouth daily.      No current facility-administered medications on file prior to visit.    LABS/IMAGING: No results found for this or any previous visit (from the past 48 hour(s)). No results found.  LIPID PANEL: No results found for: CHOL, TRIG, HDL, CHOLHDL, VLDL, LDLCALC, LDLDIRECT   WEIGHTS: Wt Readings from Last 3 Encounters:  10/09/21 112 lb 6.4 oz (51 kg)  09/08/21 108 lb 9.6 oz (49.3 kg)  06/30/12 147 lb (66.7 kg)    VITALS: BP (!) 132/58 (BP Location: Left Arm, Patient Position: Sitting, Cuff Size: Normal)    Pulse 96    Ht 5' (1.524 m)    Wt 112 lb 6.4 oz (51 kg)    SpO2 97%    BMI 21.95 kg/m   EXAM: General appearance: alert and no distress Neck: no carotid bruit, no JVD, and thyroid not enlarged, symmetric, no tenderness/mass/nodules Lungs: clear to auscultation bilaterally Heart: regular rate and rhythm, S1, S2  normal, and systolic murmur: late systolic 3/6, crescendo at 2nd right intercostal space Abdomen: soft, non-tender; bowel sounds normal; no masses,  no organomegaly Extremities: extremities normal, atraumatic, no cyanosis or edema Pulses: 2+ and symmetric Skin: Skin color, texture, turgor normal. No rashes or lesions Neurologic: Grossly normal Psych: Pleasant  EKG: A-fib with RVR 109- personally reviewed  ASSESSMENT: Severe aortic stenosis/moderate aortic insufficiency Recurrent presyncope CKD stage IIIb Chronic diastolic heart failure Essential hypertension  PLAN: 1.   [name] has severe aortic stenosis with moderate aortic insufficiency on her most  recent echo.  She has had numerous presyncopal episodes including several after being discharged.  She is considered TAVR and is amenable for further evaluation.  I will refer her to our multidisciplinary valve clinic.  Plan follow-up with me afterwards  Pixie Casino, MD, FACC, [place] Director of the [facility]  Card[facility] Diplomate of the American Board of Clinical Lipidology Attending Cardiologist  Direct Dial: [phone]   Fax: [phone]  Website:  [email or web address]   Nadean Corwin Burke Terry 10/09/2021, 1:28 PM

## 2021-10-12 NOTE — Progress Notes (Signed)
° ° °Patient ID: °Sharon Campbell °MRN: [id number] °DOB/AGE: 85/24/1936 86 y.o. ° °Primary Care Physician:Stoneking, Hal, MD °Primary Cardiologist: Hilty, Chad, MD ° ° °FOCUSED CARDIOVASCULAR PROBLEM LIST:   °1.  Severe aortic stenosis with a aortic valve area 0.6 cm grade, mean gradient 57 mmHg, and peak velocity of 4.9 m/s with moderate aortic insufficiency; ejection fraction 60 to 65%; no conduction abnormalities °2.  Hypertension °3.  Hyperlipidemia °4.  Chronic kidney disease with creatinine of 1.23, GFR 40s ° ° °HISTORY OF PRESENT ILLNESS: °The patient is a 86 y.o. female with the indicated medical history here for recommendations regarding severe aortic stenosis with moderate aortic insufficiency.  She was recently hospitalized for presyncope and found to have severe aortic stenosis and moderate aortic insufficiency.  Since being discharged home she is noticed increasing shortness of breath.  Her son tells me that she has been slowing down for about a year.  She lives with her husband who has Alzheimer's and she is the primary caregiver.  She is unable to keep her relatively large house clean due to dyspnea and fatigue.  They are in the process of placing herself and her husband in assisted living.  She denies any exertional angina.  He has had no recurrent presyncope or syncope.  She denies any palpitations, paroxysmal nocturnal dyspnea, orthopnea.  She has required no hospitalizations or emergency room visits. ° °When compared with what she could do a few years ago she is quite limited.  She tells me about a year and a half ago she is able to do everything she needed to do in a day. ° °Of note she has good dental health with upper and lower dentures. ° °Past Medical History:  °Diagnosis Date  ° Glaucoma   ° High cholesterol   ° Hypertension   ° Thyroid disease   °  °Past Surgical History:  °Procedure Laterality Date  ° CESAREAN SECTION    ° x 2  °  °Family History  °Problem Relation Age of Onset  °  Hyperlipidemia Mother   ° Cancer Father   °  °Social History  ° °Socioeconomic History  ° Marital status: Married  °  Spouse name: Not on file  ° Number of children: Not on file  ° Years of education: Not on file  ° Highest education level: Not on file  °Occupational History  ° Not on file  °Tobacco Use  ° Smoking status: Former  °  Types: Cigarettes  ° Smokeless tobacco: Never  ° Tobacco comments:  °  Quit around 1990  °Substance and Sexual Activity  ° Alcohol use: Yes  °  Comment: rarely  ° Drug use: Never  ° Sexual activity: Not on file  °Other Topics Concern  ° Not on file  °Social History Narrative  ° Not on file  ° °Social Determinants of Health  ° °Financial Resource Strain: Not on file  °Food Insecurity: Not on file  °Transportation Needs: Not on file  °Physical Activity: Not on file  °Stress: Not on file  °Social Connections: Not on file  °Intimate Partner Violence: Not on file  °  ° °Prior to Admission medications   °Medication Sig Start Date End Date Taking? Authorizing Provider  °acetaminophen (TYLENOL) 500 MG tablet Take 500 mg by mouth daily as needed for headache.    [provider]  °ALPRAZolam (XANAX) 0.25 MG tablet Take 0.5 tablets by mouth at bedtime. For anxiety 06/01/12   [provider]  °atorvastatin (LIPITOR) 10   MG tablet Take 10 mg by mouth daily. 09/01/21   [provider]  dorzolamide-timolol (COSOPT) 22.3-6.8 MG/ML ophthalmic solution Place 1 drop into the right eye at bedtime. 06/24/12   [provider]  fluvoxaMINE (LUVOX) 100 MG tablet Take 2 tablets (200 mg total) by mouth at bedtime. 09/08/21   Samuella Cota, MD  latanoprost (XALATAN) 0.005 % ophthalmic solution Place 1 drop into both eyes at bedtime.  06/19/12   [provider]  Multiple Vitamin (MULTIVITAMIN WITH MINERALS) TABS Take 1 tablet by mouth daily.    [provider]  SYNTHROID 88 MCG tablet Take 88 mcg by mouth daily.  06/12/12   [provider]    No  Known Allergies  REVIEW OF SYSTEMS:  General: no fevers/chills/night sweats Eyes: no blurry vision, diplopia, or amaurosis ENT: no sore throat or hearing loss Resp: no cough, wheezing, or hemoptysis CV: no edema or palpitations GI: no abdominal pain, nausea, vomiting, diarrhea, or constipation GU: no dysuria, frequency, or hematuria Skin: no rash Neuro: no headache, numbness, tingling, or weakness of extremities Musculoskeletal: no joint pain or swelling Heme: no bleeding, DVT, or easy bruising Endo: no polydipsia or polyuria  There were no vitals taken for this visit.  PHYSICAL EXAM: GEN:  AO x 3 in no acute distress HEENT: normal Dentition: Normal Neck: JVP normal. +2 carotid upstrokes without bruits. No thyromegaly. Lungs: equal expansion, clear bilaterally CV: Apex is discrete and nondisplaced, RRR with 4/6 SEM Abd: soft, non-tender, non-distended; no bruit; positive bowel sounds Ext: no edema, ecchymoses, or cyanosis Vascular: 2+ femoral pulses, 2+ radial pulses       Skin: warm and dry without rash Neuro: CN II-XII grossly intact; motor and sensory grossly intact    DATA AND STUDIES:  EKG: Sinus rhythm with left ventricular hypertrophy  2D ECHO: 1/23: 1. Left ventricular ejection fraction, by estimation, is 60 to 65%. The  left ventricle has normal function. The left ventricle has no regional  wall motion abnormalities. There is moderate asymmetric left ventricular  hypertrophy of the basal-septal  segment. Left ventricular diastolic parameters are consistent with Grade  II diastolic dysfunction (pseudonormalization). Elevated left atrial  pressure.   2. Right ventricular systolic function is normal. The right ventricular  size is normal. There is mildly elevated pulmonary artery systolic  pressure. The estimated right ventricular systolic pressure is 123XX123 mmHg.   3. Left atrial size was moderately dilated.   4. The mitral valve is normal in structure. Mild to  moderate mitral valve  regurgitation.   5. The inferior vena cava is normal in size with greater than 50%  respiratory variability, suggesting right atrial pressure of 3 mmHg.   6. The aortic valve was not well visualized. There is severe calcifcation  of the aortic valve. Aortic valve regurgitation is moderate. Severe aortic  valve stenosis. Vmax 4.9 m/s, MG 63mmHg, AVA 0.6 cm^2, DI 0.20   CARDIAC CATH: Pending  STS RISK CALCULATOR: Pending  NHYA CLASS: 2-3    ASSESSMENT AND PLAN:   Nonrheumatic aortic valve stenosis  The patient has a developed signs and symptoms attributable to her severe aortic valvular disease.  We had a long discussion about potential treatment options.  The patient would like to pursue definitive therapy.  I will obtain a TAVR CTA, coronary angiography study, and refer to cardiothoracic surgery for an opinion.    I have personally reviewed the patients imaging data as summarized above.  I have reviewed the natural history  of aortic stenosis with the patient and family members who are present today. We have discussed the limitations of medical therapy and the poor prognosis associated with symptomatic aortic stenosis. We have also reviewed potential treatment options, including palliative medical therapy, conventional surgical aortic valve replacement, and transcatheter aortic valve replacement. We discussed treatment options in the context of this patient's specific comorbid medical conditions.   All of the patient's questions were answered today. Will make further recommendations based on the results of studies outlined above.   Total time spent with patient today 60 minutes. This includes reviewing records, evaluating the patient and coordinating care.   Early Osmond, MD  10/13/2021 10:05 AM    [address], [place], [place]  [address] Phone: [phone]; Fax: [phone]

## 2021-10-12 NOTE — H&P (View-Only) (Signed)
Patient ID: Sharon Campbell MRN: [id number] DOB/AGE: [birth year] 87 y.o.  Primary Care Physician:Stoneking, Hal, MD Primary Cardiologist: Hilty, [place], MD   FOCUSED CARDIOVASCULAR PROBLEM LIST:   1.  Severe aortic stenosis with a aortic valve area 0.6 cm grade, mean gradient 57 mmHg, and peak velocity of 4.9 m/s with moderate aortic insufficiency; ejection fraction 60 to 65%; no conduction abnormalities 2.  Hypertension 3.  Hyperlipidemia 4.  Chronic kidney disease with creatinine of 1.23, GFR 40s   HISTORY OF PRESENT ILLNESS: The patient is a 86 y.o. female with the indicated medical history here for recommendations regarding severe aortic stenosis with moderate aortic insufficiency.  She was recently hospitalized for presyncope and found to have severe aortic stenosis and moderate aortic insufficiency.  Since being discharged home she is noticed increasing shortness of breath.  Her son tells me that she has been slowing down for about a year.  She lives with her husband who has Alzheimer's and she is the primary caregiver.  She is unable to keep her relatively large house clean due to dyspnea and fatigue.  They are in the process of placing herself and her husband in assisted living.  She denies any exertional angina.  He has had no recurrent presyncope or syncope.  She denies any palpitations, paroxysmal nocturnal dyspnea, orthopnea.  She has required no hospitalizations or emergency room visits.  When compared with what she could do a few years ago she is quite limited.  She tells me about a year and a half ago she is able to do everything she needed to do in a day.  Of note she has good dental health with upper and lower dentures.  Past Medical History:  Diagnosis Date   Glaucoma    High cholesterol    Hypertension    Thyroid disease     Past Surgical History:  Procedure Laterality Date   CESAREAN SECTION     x 2    Family History  Problem Relation Age of Onset    Hyperlipidemia Mother    Cancer Father     Social History   Socioeconomic History   Marital status: Married    Spouse name: Not on file   Number of children: Not on file   Years of education: Not on file   Highest education level: Not on file  Occupational History   Not on file  Tobacco Use   Smoking status: Former    Types: Cigarettes   Smokeless tobacco: Never   Tobacco comments:    Quit around 7  Substance and Sexual Activity   Alcohol use: Yes    Comment: rarely   Drug use: Never   Sexual activity: Not on file  Other Topics Concern   Not on file  Social History Narrative   Not on file   Social Determinants of Health   Financial Resource Strain: Not on file  Food Insecurity: Not on file  Transportation Needs: Not on file  Physical Activity: Not on file  Stress: Not on file  Social Connections: Not on file  Intimate Partner Violence: Not on file     Prior to Admission medications   Medication Sig Start Date End Date Taking? Authorizing Provider  acetaminophen (TYLENOL) 500 MG tablet Take 500 mg by mouth daily as needed for headache.    [provider]  ALPRAZolam Prudy Feeler) 0.25 MG tablet Take 0.5 tablets by mouth at bedtime. For anxiety 06/01/12   [provider]  atorvastatin (LIPITOR) 10  MG tablet Take 10 mg by mouth daily. 09/01/21   [provider]  dorzolamide-timolol (COSOPT) 22.3-6.8 MG/ML ophthalmic solution Place 1 drop into the right eye at bedtime. 06/24/12   [provider]  fluvoxaMINE (LUVOX) 100 MG tablet Take 2 tablets (200 mg total) by mouth at bedtime. 09/08/21   Samuella Cota, MD  latanoprost (XALATAN) 0.005 % ophthalmic solution Place 1 drop into both eyes at bedtime.  06/19/12   [provider]  Multiple Vitamin (MULTIVITAMIN WITH MINERALS) TABS Take 1 tablet by mouth daily.    [provider]  SYNTHROID 88 MCG tablet Take 88 mcg by mouth daily.  06/12/12   [provider]    No  Known Allergies  REVIEW OF SYSTEMS:  General: no fevers/chills/night sweats Eyes: no blurry vision, diplopia, or amaurosis ENT: no sore throat or hearing loss Resp: no cough, wheezing, or hemoptysis CV: no edema or palpitations GI: no abdominal pain, nausea, vomiting, diarrhea, or constipation GU: no dysuria, frequency, or hematuria Skin: no rash Neuro: no headache, numbness, tingling, or weakness of extremities Musculoskeletal: no joint pain or swelling Heme: no bleeding, DVT, or easy bruising Endo: no polydipsia or polyuria  There were no vitals taken for this visit.  PHYSICAL EXAM: GEN:  AO x 3 in no acute distress HEENT: normal Dentition: Normal Neck: JVP normal. +2 carotid upstrokes without bruits. No thyromegaly. Lungs: equal expansion, clear bilaterally CV: Apex is discrete and nondisplaced, RRR with 4/6 SEM Abd: soft, non-tender, non-distended; no bruit; positive bowel sounds Ext: no edema, ecchymoses, or cyanosis Vascular: 2+ femoral pulses, 2+ radial pulses       Skin: warm and dry without rash Neuro: CN II-XII grossly intact; motor and sensory grossly intact    DATA AND STUDIES:  EKG: Sinus rhythm with left ventricular hypertrophy  2D ECHO: 1/23: 1. Left ventricular ejection fraction, by estimation, is 60 to 65%. The  left ventricle has normal function. The left ventricle has no regional  wall motion abnormalities. There is moderate asymmetric left ventricular  hypertrophy of the basal-septal  segment. Left ventricular diastolic parameters are consistent with Grade  II diastolic dysfunction (pseudonormalization). Elevated left atrial  pressure.   2. Right ventricular systolic function is normal. The right ventricular  size is normal. There is mildly elevated pulmonary artery systolic  pressure. The estimated right ventricular systolic pressure is 123XX123 mmHg.   3. Left atrial size was moderately dilated.   4. The mitral valve is normal in structure. Mild to  moderate mitral valve  regurgitation.   5. The inferior vena cava is normal in size with greater than 50%  respiratory variability, suggesting right atrial pressure of 3 mmHg.   6. The aortic valve was not well visualized. There is severe calcifcation  of the aortic valve. Aortic valve regurgitation is moderate. Severe aortic  valve stenosis. Vmax 4.9 m/s, MG 63mmHg, AVA 0.6 cm^2, DI 0.20   CARDIAC CATH: Pending  STS RISK CALCULATOR: Pending  NHYA CLASS: 2-3    ASSESSMENT AND PLAN:   Nonrheumatic aortic valve stenosis  The patient has a developed signs and symptoms attributable to her severe aortic valvular disease.  We had a long discussion about potential treatment options.  The patient would like to pursue definitive therapy.  I will obtain a TAVR CTA, coronary angiography study, and refer to cardiothoracic surgery for an opinion.    I have personally reviewed the patients imaging data as summarized above.  I have reviewed the natural history  of aortic stenosis with the patient and family members who are present today. We have discussed the limitations of medical therapy and the poor prognosis associated with symptomatic aortic stenosis. We have also reviewed potential treatment options, including palliative medical therapy, conventional surgical aortic valve replacement, and transcatheter aortic valve replacement. We discussed treatment options in the context of this patient's specific comorbid medical conditions.   All of the patient's questions were answered today. Will make further recommendations based on the results of studies outlined above.   Total time spent with patient today 60 minutes. This includes reviewing records, evaluating the patient and coordinating care.   Early Osmond, MD  10/13/2021 10:05 AM    [address], [place], [place]  [address] Phone: [phone]; Fax: [phone]

## 2021-10-13 ENCOUNTER — Ambulatory Visit: Payer: Medicare Other | Admitting: Internal Medicine

## 2021-10-13 ENCOUNTER — Other Ambulatory Visit: Payer: Self-pay

## 2021-10-13 ENCOUNTER — Encounter: Payer: Self-pay | Admitting: Internal Medicine

## 2021-10-13 VITALS — BP 112/78 | HR 83 | Ht 60.0 in | Wt 110.8 lb

## 2021-10-13 DIAGNOSIS — Z01812 Encounter for preprocedural laboratory examination: Secondary | ICD-10-CM | POA: Diagnosis not present

## 2021-10-13 DIAGNOSIS — Z01818 Encounter for other preprocedural examination: Secondary | ICD-10-CM

## 2021-10-13 DIAGNOSIS — I350 Nonrheumatic aortic (valve) stenosis: Secondary | ICD-10-CM | POA: Diagnosis not present

## 2021-10-13 LAB — CBC
Hematocrit: 34.2 % (ref 34.0–46.6)
Hemoglobin: 11.2 g/dL (ref 11.1–15.9)
MCH: 28.3 pg (ref 26.6–33.0)
MCHC: 32.7 g/dL (ref 31.5–35.7)
MCV: 86 fL (ref 79–97)
Platelets: 256 10*3/uL (ref 150–450)
RBC: 3.96 x10E6/uL (ref 3.77–5.28)
RDW: 13.6 % (ref 11.7–15.4)
WBC: 8.4 10*3/uL (ref 3.4–10.8)

## 2021-10-13 LAB — BASIC METABOLIC PANEL
BUN/Creatinine Ratio: 21 (ref 12–28)
BUN: 27 mg/dL (ref 8–27)
CO2: 26 mmol/L (ref 20–29)
Calcium: 9.8 mg/dL (ref 8.7–10.3)
Chloride: 102 mmol/L (ref 96–106)
Creatinine, Ser: 1.28 mg/dL — ABNORMAL HIGH (ref 0.57–1.00)
Glucose: 102 mg/dL — ABNORMAL HIGH (ref 70–99)
Potassium: 4.8 mmol/L (ref 3.5–5.2)
Sodium: 140 mmol/L (ref 134–144)
eGFR: 41 mL/min/{1.73_m2} — ABNORMAL LOW (ref >59)

## 2021-10-13 NOTE — Progress Notes (Signed)
Pre Surgical Assessment: 5 M Walk Test  25M=16.21ft  5 Meter Walk Test- trial 1: 6.34 seconds 5 Meter Walk Test- trial 2: 5.22 seconds 5 Meter Walk Test- trial 3: 6.35 seconds 5 Meter Walk Test Average: 5.97 seconds

## 2021-10-13 NOTE — Patient Instructions (Signed)
Medication Instructions:  No changes today.  *If you need a refill on your cardiac medications before your next appointment, please call your pharmacy*   Lab Work: Today: CBC, BMET   Testing/Procedures: Your physician has requested that you have a cardiac catheterization. Cardiac catheterization is used to diagnose and/or treat various heart conditions. Doctors may recommend this procedure for a number of different reasons. The most common reason is to evaluate chest pain. Chest pain can be a symptom of coronary artery disease (CAD), and cardiac catheterization can show whether plaque is narrowing or blocking your hearts arteries. This procedure is also used to evaluate the valves, as well as measure the blood flow and oxygen levels in different parts of your heart. For further information please visit [email or web address]. Please follow instruction sheet, as given.   Follow-Up: Per Structural Heart Valve Team   Other Instructions  [place] OFFICE [place], [place] [address] Dept: [phone] Loc: [place]  10/13/2021  You are scheduled for a Cardiac Catheterization on Wednesday, March 1 with Dr. Lenna Sciara.  1. Please arrive at the [facility] (Main Entrance A) at [facility]: [address] [place], [place] [address] at 6:30 AM (This time is two hours before your procedure to ensure your preparation). Free valet parking service is available.   Special note: Every effort is made to have your procedure done on time. Please understand that emergencies sometimes delay scheduled procedures.  2. Diet: Do not eat solid foods after midnight.  The patient may have clear liquids until 5am upon the day of the procedure.  3. Labs: You will need to have blood drawn today.  You do not need to be fasting.  4. Medication instructions in preparation for your  procedure:   Contrast Allergy: No  On the morning of your procedure, take your Aspirin 81 mg and any morning medicines NOT listed above.  You may use sips of water.  5. Plan for one night stay--bring personal belongings. 6. Bring a current list of your medications and current insurance cards. 7. You MUST have a responsible person to drive you home. 8. Someone MUST be with you the first 24 hours after you arrive home or your discharge will be delayed. 9. Please wear clothes that are easy to get on and off and wear slip-on shoes.  Thank you for allowing [place] to care for you!   -- [facility]

## 2021-10-16 ENCOUNTER — Telehealth: Payer: Self-pay | Admitting: Internal Medicine

## 2021-10-16 NOTE — Telephone Encounter (Signed)
Patient's son returning call in regards to lab results. He states to please call him with all results because she doesn't always understand what she is being told.

## 2021-10-20 ENCOUNTER — Telehealth: Payer: Self-pay | Admitting: *Deleted

## 2021-10-20 NOTE — Telephone Encounter (Signed)
Patient's son is returning call. 

## 2021-10-20 NOTE — Telephone Encounter (Signed)
Left detailed message of stable labs.  These were preprocedure labs for upcoming heart catheterization.  Adv to call back if he wishes to discuss further.

## 2021-10-20 NOTE — Telephone Encounter (Addendum)
Cardiac catheterization scheduled at [facility] for: Wednesday October 28, 2021 11:30 AM Arrive [facility] Main Entrance A [facility]) at: 6:30 AM-pre-procedure hydration   Diet-no solid food after midnight prior to cath, clear liquids until 5 AM day of procedure.   Medication instructions for procedure: -Usual morning medications can be taken pre-cath with sips of water including aspirin 81 mg.    Must have responsible adult to drive home post procedure and be with patient first 24 hours after arriving home.  [facility] does allow one visitor to wait in the waiting room during the time you are there.   Patient reports does not currently have any new symptoms concerning for COVID-19 and no household members with COVID-19 like illness.    Left message for patient's son, Jeannett Senior, to call back to discuss procedure time change to 11:30 AM to allow time for pre-procedure hydration.

## 2021-10-21 NOTE — Telephone Encounter (Signed)
Reviewed procedure instructions, discussed pre-procedure hydration with patient's son Jeannett Senior.

## 2021-10-28 ENCOUNTER — Other Ambulatory Visit: Payer: Self-pay

## 2021-10-28 ENCOUNTER — Ambulatory Visit (HOSPITAL_COMMUNITY)
Admission: RE | Admit: 2021-10-28 | Discharge: 2021-10-29 | Disposition: A | Discharge status: Home / Self Care | Payer: Medicare Other | Attending: Internal Medicine | Admitting: Internal Medicine

## 2021-10-28 ENCOUNTER — Encounter (HOSPITAL_COMMUNITY)
Admission: RE | Disposition: A | Discharge status: Home / Self Care | Payer: Self-pay | Source: Home / Self Care | Attending: Internal Medicine

## 2021-10-28 DIAGNOSIS — I2510 Atherosclerotic heart disease of native coronary artery without angina pectoris: Secondary | ICD-10-CM | POA: Diagnosis present

## 2021-10-28 DIAGNOSIS — E079 Disorder of thyroid, unspecified: Secondary | ICD-10-CM | POA: Insufficient documentation

## 2021-10-28 DIAGNOSIS — Z87891 Personal history of nicotine dependence: Secondary | ICD-10-CM | POA: Insufficient documentation

## 2021-10-28 DIAGNOSIS — Z20822 Contact with and (suspected) exposure to covid-19: Secondary | ICD-10-CM | POA: Diagnosis not present

## 2021-10-28 DIAGNOSIS — I25119 Atherosclerotic heart disease of native coronary artery with unspecified angina pectoris: Secondary | ICD-10-CM | POA: Diagnosis not present

## 2021-10-28 DIAGNOSIS — N189 Chronic kidney disease, unspecified: Secondary | ICD-10-CM | POA: Insufficient documentation

## 2021-10-28 DIAGNOSIS — I25118 Atherosclerotic heart disease of native coronary artery with other forms of angina pectoris: Secondary | ICD-10-CM

## 2021-10-28 DIAGNOSIS — E785 Hyperlipidemia, unspecified: Secondary | ICD-10-CM | POA: Insufficient documentation

## 2021-10-28 DIAGNOSIS — Z955 Presence of coronary angioplasty implant and graft: Secondary | ICD-10-CM | POA: Diagnosis not present

## 2021-10-28 DIAGNOSIS — I352 Nonrheumatic aortic (valve) stenosis with insufficiency: Secondary | ICD-10-CM | POA: Diagnosis not present

## 2021-10-28 DIAGNOSIS — I2582 Chronic total occlusion of coronary artery: Secondary | ICD-10-CM | POA: Diagnosis not present

## 2021-10-28 DIAGNOSIS — I350 Nonrheumatic aortic (valve) stenosis: Secondary | ICD-10-CM

## 2021-10-28 DIAGNOSIS — R55 Syncope and collapse: Secondary | ICD-10-CM | POA: Diagnosis present

## 2021-10-28 DIAGNOSIS — I129 Hypertensive chronic kidney disease with stage 1 through stage 4 chronic kidney disease, or unspecified chronic kidney disease: Secondary | ICD-10-CM | POA: Diagnosis not present

## 2021-10-28 DIAGNOSIS — Z79890 Hormone replacement therapy (postmenopausal): Secondary | ICD-10-CM | POA: Diagnosis not present

## 2021-10-28 DIAGNOSIS — Z79899 Other long term (current) drug therapy: Secondary | ICD-10-CM | POA: Insufficient documentation

## 2021-10-28 HISTORY — PX: CORONARY STENT INTERVENTION: CATH118234

## 2021-10-28 HISTORY — PX: RIGHT/LEFT HEART CATH AND CORONARY ANGIOGRAPHY: CATH118266

## 2021-10-28 HISTORY — PX: INTRAVASCULAR ULTRASOUND/IVUS: CATH118244

## 2021-10-28 LAB — POCT I-STAT EG7
Acid-base deficit: 1 mmol/L (ref 0.0–2.0)
Acid-base deficit: 1 mmol/L (ref 0.0–2.0)
Bicarbonate: 25.4 mmol/L (ref 20.0–28.0)
Bicarbonate: 25.2 mmol/L (ref 20.0–28.0)
Calcium, Ion: 1.18 mmol/L (ref 1.15–1.40)
Calcium, Ion: 1.15 mmol/L (ref 1.15–1.40)
HCT: 28 % — ABNORMAL LOW (ref 36.0–46.0)
HCT: 28 % — ABNORMAL LOW (ref 36.0–46.0)
Hemoglobin: 9.5 g/dL — ABNORMAL LOW (ref 12.0–15.0)
Hemoglobin: 9.5 g/dL — ABNORMAL LOW (ref 12.0–15.0)
O2 Saturation: 64 %
O2 Saturation: 54 %
Potassium: 4.4 mmol/L (ref 3.5–5.1)
Potassium: 4.3 mmol/L (ref 3.5–5.1)
Sodium: 139 mmol/L (ref 135–145)
Sodium: 139 mmol/L (ref 135–145)
TCO2: 27 mmol/L (ref 22–32)
TCO2: 27 mmol/L (ref 22–32)
pCO2, Ven: 48.6 mmHg (ref 44–60)
pCO2, Ven: 48 mmHg (ref 44–60)
pH, Ven: 7.325 (ref 7.25–7.43)
pH, Ven: 7.328 (ref 7.25–7.43)
pO2, Ven: 36 mmHg (ref 32–45)
pO2, Ven: 31 mmHg — CL (ref 32–45)

## 2021-10-28 LAB — POCT ACTIVATED CLOTTING TIME
Activated Clotting Time: 209 seconds
Activated Clotting Time: 317 seconds
Activated Clotting Time: 269 seconds
Activated Clotting Time: 558 seconds

## 2021-10-28 LAB — POCT I-STAT 7, (LYTES, BLD GAS, ICA,H+H)
Acid-base deficit: 1 mmol/L (ref 0.0–2.0)
Bicarbonate: 24.9 mmol/L (ref 20.0–28.0)
Calcium, Ion: 1.2 mmol/L (ref 1.15–1.40)
HCT: 28 % — ABNORMAL LOW (ref 36.0–46.0)
Hemoglobin: 9.5 g/dL — ABNORMAL LOW (ref 12.0–15.0)
O2 Saturation: 98 %
Potassium: 4.4 mmol/L (ref 3.5–5.1)
Sodium: 138 mmol/L (ref 135–145)
TCO2: 26 mmol/L (ref 22–32)
pCO2 arterial: 45.4 mmHg (ref 32–48)
pH, Arterial: 7.348 — ABNORMAL LOW (ref 7.35–7.45)
pO2, Arterial: 113 mmHg — ABNORMAL HIGH (ref 83–108)

## 2021-10-28 LAB — SARS CORONAVIRUS 2 BY RT PCR (HOSPITAL ORDER, PERFORMED IN ~~LOC~~ HOSPITAL LAB): SARS Coronavirus 2: NEGATIVE

## 2021-10-28 SURGERY — RIGHT/LEFT HEART CATH AND CORONARY ANGIOGRAPHY
Anesthesia: LOCAL

## 2021-10-28 MED ORDER — LABETALOL HCL 5 MG/ML IV SOLN: 10.0000 mg | INTRAVENOUS | Status: AC | PRN

## 2021-10-28 MED ORDER — SODIUM CHLORIDE 0.9 % WEIGHT BASED INFUSION: 1.0000 mL/kg/h | INTRAVENOUS | Status: DC

## 2021-10-28 MED ORDER — ASPIRIN 81 MG PO CHEW
81.0000 mg | CHEWABLE_TABLET | Freq: Every day | ORAL | Status: DC
  Administered 2021-10-28 – 2021-10-29 (×2): 81 mg via ORAL
  Filled 2021-10-28 (×2): qty 1

## 2021-10-28 MED ORDER — HEPARIN (PORCINE) IN NACL 1000-0.9 UT/500ML-% IV SOLN
INTRAVENOUS | Status: DC | PRN
  Administered 2021-10-28 (×2): 500 mL

## 2021-10-28 MED ORDER — SODIUM CHLORIDE 0.9% FLUSH
3.0000 mL | Freq: Two times a day (BID) | INTRAVENOUS | Status: DC
  Administered 2021-10-28 – 2021-10-29 (×2): 3 mL via INTRAVENOUS

## 2021-10-28 MED ORDER — CLOPIDOGREL BISULFATE 300 MG PO TABS
ORAL_TABLET | ORAL | Status: AC
  Filled 2021-10-28: qty 2

## 2021-10-28 MED ORDER — FENTANYL CITRATE (PF) 100 MCG/2ML IJ SOLN
INTRAMUSCULAR | Status: DC | PRN
  Administered 2021-10-28 (×2): 25 ug via INTRAVENOUS

## 2021-10-28 MED ORDER — ONDANSETRON HCL 4 MG/2ML IJ SOLN: 4.0000 mg | Freq: Four times a day (QID) | INTRAMUSCULAR | Status: DC | PRN

## 2021-10-28 MED ORDER — HEPARIN SODIUM (PORCINE) 1000 UNIT/ML IJ SOLN
INTRAMUSCULAR | Status: DC | PRN
  Administered 2021-10-28 (×2): 4000 [IU] via INTRAVENOUS
  Administered 2021-10-28: 5000 [IU] via INTRAVENOUS

## 2021-10-28 MED ORDER — SODIUM CHLORIDE 0.9 % WEIGHT BASED INFUSION: 3.0000 mL/kg/h | INTRAVENOUS | Status: DC

## 2021-10-28 MED ORDER — HEPARIN (PORCINE) IN NACL 1000-0.9 UT/500ML-% IV SOLN
INTRAVENOUS | Status: AC
  Filled 2021-10-28: qty 500

## 2021-10-28 MED ORDER — CLOPIDOGREL BISULFATE 75 MG PO TABS
75.0000 mg | ORAL_TABLET | Freq: Every day | ORAL | Status: DC
  Administered 2021-10-29: 75 mg via ORAL
  Filled 2021-10-28: qty 1

## 2021-10-28 MED ORDER — IOHEXOL 350 MG/ML SOLN
INTRAVENOUS | Status: DC | PRN
  Administered 2021-10-28: 205 mL

## 2021-10-28 MED ORDER — HYDRALAZINE HCL 20 MG/ML IJ SOLN: 10.0000 mg | INTRAMUSCULAR | Status: AC | PRN

## 2021-10-28 MED ORDER — SODIUM CHLORIDE 0.9% FLUSH
3.0000 mL | Freq: Two times a day (BID) | INTRAVENOUS | Status: DC
  Administered 2021-10-28: 3 mL via INTRAVENOUS

## 2021-10-28 MED ORDER — LIDOCAINE HCL (PF) 1 % IJ SOLN
INTRAMUSCULAR | Status: AC
  Filled 2021-10-28: qty 30

## 2021-10-28 MED ORDER — SODIUM CHLORIDE 0.9% FLUSH: 3.0000 mL | INTRAVENOUS | Status: DC | PRN

## 2021-10-28 MED ORDER — CLOPIDOGREL BISULFATE 300 MG PO TABS
ORAL_TABLET | ORAL | Status: DC | PRN
  Administered 2021-10-28: 600 mg via ORAL

## 2021-10-28 MED ORDER — HEPARIN SODIUM (PORCINE) 1000 UNIT/ML IJ SOLN
INTRAMUSCULAR | Status: AC
  Filled 2021-10-28: qty 10

## 2021-10-28 MED ORDER — MIDAZOLAM HCL 2 MG/2ML IJ SOLN
INTRAMUSCULAR | Status: AC
  Filled 2021-10-28: qty 2

## 2021-10-28 MED ORDER — MIDAZOLAM HCL 2 MG/2ML IJ SOLN
INTRAMUSCULAR | Status: DC | PRN
  Administered 2021-10-28 (×2): 1 mg via INTRAVENOUS

## 2021-10-28 MED ORDER — VERAPAMIL HCL 2.5 MG/ML IV SOLN
INTRAVENOUS | Status: DC | PRN
  Administered 2021-10-28: 10 mL via INTRA_ARTERIAL

## 2021-10-28 MED ORDER — FENTANYL CITRATE (PF) 100 MCG/2ML IJ SOLN
INTRAMUSCULAR | Status: AC
  Filled 2021-10-28: qty 2

## 2021-10-28 MED ORDER — SODIUM CHLORIDE 0.9 % IV SOLN: 250.0000 mL | INTRAVENOUS | Status: DC | PRN

## 2021-10-28 MED ORDER — LIDOCAINE HCL (PF) 1 % IJ SOLN
INTRAMUSCULAR | Status: DC | PRN
  Administered 2021-10-28 (×2): 2 mL via INTRADERMAL

## 2021-10-28 MED ORDER — ASPIRIN 81 MG PO CHEW: 81.0000 mg | CHEWABLE_TABLET | ORAL | Status: DC

## 2021-10-28 MED ORDER — ACETAMINOPHEN 325 MG PO TABS
650.0000 mg | ORAL_TABLET | ORAL | Status: DC | PRN
  Administered 2021-10-28: 650 mg via ORAL
  Filled 2021-10-28: qty 2

## 2021-10-28 MED ORDER — VERAPAMIL HCL 2.5 MG/ML IV SOLN
INTRAVENOUS | Status: AC
  Filled 2021-10-28: qty 2

## 2021-10-28 SURGICAL SUPPLY — 26 items
BALLN SAPPHIRE 2.5X12 (BALLOONS) ×2
BALLN SAPPHIRE ~~LOC~~ 2.5X12 (BALLOONS) ×2 IMPLANT
BALLOON SAPPHIRE 2.5X12 (BALLOONS) IMPLANT
CATH BALLN WEDGE 5F 110CM (CATHETERS) ×1 IMPLANT
CATH INFINITI 6F ANG MULTIPACK (CATHETERS) ×1 IMPLANT
CATH INFINITI 6F FL3.5 (CATHETERS) ×1 IMPLANT
CATH LAUNCHER 6FR EBU 3 (CATHETERS) ×1 IMPLANT
CATH LAUNCHER 6FR EBU3.5 (CATHETERS) ×1 IMPLANT
CATH OPTICROSS HD (CATHETERS) ×1 IMPLANT
DEVICE RAD TR BAND REGULAR (VASCULAR PRODUCTS) ×1 IMPLANT
GLIDESHEATH SLEND SS 6F .021 (SHEATH) ×1 IMPLANT
GUIDEWIRE INQWIRE 1.5J.035X260 (WIRE) IMPLANT
GUIDEWIRE VAS SION BLUE 190 (WIRE) ×2 IMPLANT
INQWIRE 1.5J .035X260CM (WIRE) ×2
KIT ENCORE 26 ADVANTAGE (KITS) ×1 IMPLANT
KIT HEART LEFT (KITS) ×2 IMPLANT
KIT HEMO VALVE WATCHDOG (MISCELLANEOUS) ×1 IMPLANT
PACK CARDIAC CATHETERIZATION (CUSTOM PROCEDURE TRAY) ×2 IMPLANT
SHEATH GLIDE SLENDER 4/5FR (SHEATH) ×1 IMPLANT
SLED PULL BACK IVUS (MISCELLANEOUS) ×1 IMPLANT
STENT ONYX FRONTIER 2.5X15 (Permanent Stent) ×1 IMPLANT
STENT ONYX FRONTIER 3.0X12 (Permanent Stent) ×1 IMPLANT
TRANSDUCER W/STOPCOCK (MISCELLANEOUS) ×2 IMPLANT
TUBING CIL FLEX 10 FLL-RA (TUBING) ×2 IMPLANT
WIRE CHOICE GRAPHX PT 300 (WIRE) ×1 IMPLANT
WIRE EMERALD 3MM-J .035X150CM (WIRE) ×1 IMPLANT

## 2021-10-28 NOTE — Interval H&P Note (Signed)
History and Physical Interval Note: ? ?10/28/2021 ?7:21 AM ? ?Sharon Campbell  has presented today for surgery, with the diagnosis of aortic stenosis.  The various methods of treatment have been discussed with the patient and family. After consideration of risks, benefits and other options for treatment, the patient has consented to  Procedure(s): ?RIGHT/LEFT HEART CATH AND CORONARY ANGIOGRAPHY (N/A) as a surgical intervention.  The patient's history has been reviewed, patient examined, no change in status, stable for surgery.  I have reviewed the patient's chart and labs.  Questions were answered to the patient's satisfaction.   ? ?Cath Lab Visit (complete for each Cath Lab visit) ? ?Clinical Evaluation Leading to the Procedure:  ? ?ACS: No. ? ?Non-ACS:   ? ?Anginal Classification: CCS I ? ?Anti-ischemic medical therapy: Minimal Therapy (1 class of medications) ? ?Non-Invasive Test Results: No non-invasive testing performed ? ?Prior CABG: No previous CABG ? ? ? ? ? ? ? ?Orbie Pyo ? ? ?

## 2021-10-28 NOTE — Progress Notes (Addendum)
Color returning to fingers in rt hand and swelling in rt hand has decreased some. Rt hand and wrist bruised. ?

## 2021-10-28 NOTE — Progress Notes (Signed)
TR band removed. Pressure held to site; otherwise pulsatile bleeding. Dr. Ali Lowe at bedside . New TR band applied to rt wrist w/ 12cc air. No bleeding, fingers and nailbeds pink, good pleth, palpable rt radial. ?

## 2021-10-28 NOTE — Progress Notes (Signed)
Bruising/swelling expanded rt hand. Dr. Ali Lowe at bedside; aspirated all air from band; rebled; added total of 10cc air. No further bleeding. Good pleth ?

## 2021-10-28 NOTE — Progress Notes (Addendum)
On arrival to holding area post PCI, level 2 hematoma rt hand/wrist . Manual pressure proximal to band x 10 minutes w/reduction and softening of hematoma. Bruising and swelling of rt hand on top and backside opposite thumb. Brisk capillary refill. Dr. Ali Lowe assessed, 2cc removed from TR band at 1305 per order. Palpable rt radial, good pleth waveform, sensation present. Elevated rt arm on pillows. ?

## 2021-10-29 ENCOUNTER — Other Ambulatory Visit (HOSPITAL_COMMUNITY): Payer: Self-pay

## 2021-10-29 ENCOUNTER — Encounter (HOSPITAL_COMMUNITY): Payer: Self-pay | Admitting: Internal Medicine

## 2021-10-29 DIAGNOSIS — I25118 Atherosclerotic heart disease of native coronary artery with other forms of angina pectoris: Secondary | ICD-10-CM | POA: Diagnosis not present

## 2021-10-29 DIAGNOSIS — E785 Hyperlipidemia, unspecified: Secondary | ICD-10-CM | POA: Diagnosis not present

## 2021-10-29 DIAGNOSIS — E079 Disorder of thyroid, unspecified: Secondary | ICD-10-CM | POA: Diagnosis not present

## 2021-10-29 DIAGNOSIS — I350 Nonrheumatic aortic (valve) stenosis: Secondary | ICD-10-CM

## 2021-10-29 DIAGNOSIS — Z20822 Contact with and (suspected) exposure to covid-19: Secondary | ICD-10-CM | POA: Diagnosis not present

## 2021-10-29 DIAGNOSIS — I352 Nonrheumatic aortic (valve) stenosis with insufficiency: Secondary | ICD-10-CM | POA: Diagnosis not present

## 2021-10-29 DIAGNOSIS — N189 Chronic kidney disease, unspecified: Secondary | ICD-10-CM | POA: Diagnosis not present

## 2021-10-29 DIAGNOSIS — Z87891 Personal history of nicotine dependence: Secondary | ICD-10-CM | POA: Diagnosis not present

## 2021-10-29 DIAGNOSIS — Z79899 Other long term (current) drug therapy: Secondary | ICD-10-CM | POA: Diagnosis not present

## 2021-10-29 DIAGNOSIS — I129 Hypertensive chronic kidney disease with stage 1 through stage 4 chronic kidney disease, or unspecified chronic kidney disease: Secondary | ICD-10-CM | POA: Diagnosis not present

## 2021-10-29 DIAGNOSIS — I2582 Chronic total occlusion of coronary artery: Secondary | ICD-10-CM | POA: Diagnosis not present

## 2021-10-29 DIAGNOSIS — I25119 Atherosclerotic heart disease of native coronary artery with unspecified angina pectoris: Secondary | ICD-10-CM | POA: Diagnosis not present

## 2021-10-29 DIAGNOSIS — Z955 Presence of coronary angioplasty implant and graft: Secondary | ICD-10-CM | POA: Diagnosis not present

## 2021-10-29 LAB — CBC
HCT: 28.9 % — ABNORMAL LOW (ref 36.0–46.0)
Hemoglobin: 9.3 g/dL — ABNORMAL LOW (ref 12.0–15.0)
MCH: 28.5 pg (ref 26.0–34.0)
MCHC: 32.2 g/dL (ref 30.0–36.0)
MCV: 88.7 fL (ref 80.0–100.0)
Platelets: 200 10*3/uL (ref 150–400)
RBC: 3.26 MIL/uL — ABNORMAL LOW (ref 3.87–5.11)
RDW: 14.2 % (ref 11.5–15.5)
WBC: 6.3 10*3/uL (ref 4.0–10.5)
nRBC: 0 % (ref 0.0–0.2)

## 2021-10-29 LAB — BASIC METABOLIC PANEL
Anion gap: 9 (ref 5–15)
BUN: 23 mg/dL (ref 8–23)
CO2: 23 mmol/L (ref 22–32)
Calcium: 8.6 mg/dL — ABNORMAL LOW (ref 8.9–10.3)
Chloride: 102 mmol/L (ref 98–111)
Creatinine, Ser: 1.45 mg/dL — ABNORMAL HIGH (ref 0.44–1.00)
GFR, Estimated: 36 mL/min — ABNORMAL LOW (ref >60)
Glucose, Bld: 95 mg/dL (ref 70–99)
Potassium: 4.3 mmol/L (ref 3.5–5.1)
Sodium: 134 mmol/L — ABNORMAL LOW (ref 135–145)

## 2021-10-29 MED ORDER — FUROSEMIDE 40 MG PO TABS
40.0000 mg | ORAL_TABLET | Freq: Every day | ORAL | Status: DC
Start: 2021-10-29 — End: 2021-10-29

## 2021-10-29 MED ORDER — CLOPIDOGREL BISULFATE 75 MG PO TABS
75.0000 mg | ORAL_TABLET | Freq: Every day | ORAL | 3 refills | Status: AC
  Filled 2021-10-29: qty 90, 90d supply, fill #0

## 2021-10-29 MED ORDER — ATORVASTATIN CALCIUM 80 MG PO TABS
80.0000 mg | ORAL_TABLET | Freq: Every day | ORAL | 3 refills | Status: AC
  Filled 2021-10-29: qty 90, 90d supply, fill #0

## 2021-10-29 MED ORDER — ATORVASTATIN CALCIUM 80 MG PO TABS: 80.0000 mg | ORAL_TABLET | Freq: Every day | ORAL | Status: DC

## 2021-10-29 MED ORDER — CARVEDILOL 6.25 MG PO TABS
6.2500 mg | ORAL_TABLET | Freq: Two times a day (BID) | ORAL | 3 refills | Status: AC
  Filled 2021-10-29: qty 180, 90d supply, fill #0

## 2021-10-29 MED ORDER — FUROSEMIDE 40 MG PO TABS
40.0000 mg | ORAL_TABLET | Freq: Every day | ORAL | 5 refills | Status: AC
  Filled 2021-10-29: qty 30, 30d supply, fill #0

## 2021-10-29 MED ORDER — CARVEDILOL 6.25 MG PO TABS
6.2500 mg | ORAL_TABLET | Freq: Two times a day (BID) | ORAL | Status: DC
Start: 2021-10-29 — End: 2021-10-29

## 2021-10-29 MED ORDER — NITROGLYCERIN 0.4 MG SL SUBL: 0.4000 mg | SUBLINGUAL_TABLET | SUBLINGUAL | Status: DC | PRN

## 2021-10-29 MED ORDER — NITROGLYCERIN 0.4 MG SL SUBL
0.4000 mg | SUBLINGUAL_TABLET | SUBLINGUAL | 5 refills | Status: AC | PRN
  Filled 2021-10-29: qty 25, 7d supply, fill #0

## 2021-10-29 MED ORDER — ASPIRIN 81 MG PO CHEW
81.0000 mg | CHEWABLE_TABLET | Freq: Every day | ORAL | 3 refills | Status: AC
  Filled 2021-10-29: qty 90, 90d supply, fill #0

## 2021-10-29 NOTE — Progress Notes (Signed)
CARDIAC REHAB PHASE I  ? ?PRE:  Rate/Rhythm: 92 SR ? ?BP:  Sitting: 127/58     ? ?SaO2: 96 RA ? ?MODE:  Ambulation: 50 ft  ? ?POST:  Rate/Rhythm: 105 ST ? ?BP:  Sitting: 135/77 ? ?  SaO2: 97 RA ? ?Pt agreeable to ambulation. Pt ambulated 81ft in hallway assist of one with front wheel walker. Pt c/o headache, denies CP, SOB, or dizziness. Pt returned to chair. Pt educated on importance of ASA and Plavix. Pt given heart healthy diet. Reviewed site care and restrictions. Encouraged ambulation with emphasis on safety. Pt assisted to BR. BR call light in her hand. Will refer to CRP II [place]. ? ?0921-1010 ?Reynold Bowen, RN BSN ?10/29/2021 ?10:07 AM ? ?

## 2021-10-29 NOTE — Plan of Care (Signed)

## 2021-10-29 NOTE — Discharge Summary (Signed)
Discharge Summary    Patient ID: Sharon Campbell MRN: [id number]; DOB: 06-07-1935  Admit date: 10/28/2021 Discharge date: 10/29/2021  PCP:  [name], MD   [facility] Providers Cardiologist:  Pixie Casino, MD    Discharge Diagnoses    Principal Problem:   CAD (coronary artery disease) Active Problems:   Near syncope   Nonrheumatic aortic (valve) stenosis    Diagnostic Studies/Procedures    Right and Left Heart Catheterization 10/28/21    Ost LM to Mid LM lesion is 60% stenosed.   RPDA lesion is 50% stenosed.   Prox LAD lesion is 40% stenosed.   Mid LAD-1 lesion is 60% stenosed.   Mid LAD-2 lesion is 50% stenosed.   1st Mrg lesion is 80% stenosed.   Dist Cx lesion is 99% stenosed.   A stent was successfully placed.   A stent was successfully placed.   Post intervention, there is a 0% residual stenosis.   Post intervention, there is a 0% residual stenosis.   LV end diastolic pressure is severely elevated.   The left ventricular ejection fraction is 50-55% by visual estimate.   1.  High-grade obtuse marginal treated with 1 drug-eluting stent and 60% left main treated with 1 drug-eluting stent with IVUS optimization.  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 2.  Cardiac output of 3.7 to 2 L/min and index of 2.5 L/min/m with the following hemodynamics:   Right atrial pressure 16/13 with a mean of 12 mmHg Right ventricular pressure 56/9 with a RV end-diastolic pressure of 21 mmHg Wedge pressure 26/30 with a mean of 22 mmHg PA pressure 55/22 with a mean of 34 mmHg; PAPI 2.5 LVEDP 58mmHg   Recommendations: Dual antiplatelet therapy and consider low-dose diuretics on discharge.  Diagnostic Dominance: Right Intervention   _____________   History of Present Illness     Sharon Campbell is a 87 y.o. female with the following medical  history  FOCUSED CARDIOVASCULAR PROBLEM LIST:   1.  Severe aortic stenosis with a aortic valve area 0.6 cm grade, mean gradient 57 mmHg, and peak velocity of 4.9 m/s with moderate aortic insufficiency; ejection fraction 60 to 65%; no conduction abnormalities 2.  Hypertension 3.  Hyperlipidemia 4.  Chronic kidney disease with creatinine of 1.23, GFR 40s     HISTORY OF PRESENT ILLNESS: The patient is a 87 y.o. female with the indicated medical history who was seen by Dr. Ali Lowe for recommendations regarding severe aortic stenosis with moderate aortic insufficiency.  She had recently been hospitalized for presyncope and found to have severe aortic stenosis and moderate aortic insufficiency.  Since being discharged home she noticed increasing shortness of breath.  Her son reported that she had been slowing down for about a year.  She lives with her husband who has Alzheimer's and she is the primary caregiver.  She reported being unable to keep her relatively large house clean due to dyspnea and fatigue.  The family is in the process of placing herself and her husband in assisted living.  She denied any exertional angina.  Denied any recurrent presyncope or syncope.  She denied any palpitations, paroxysmal nocturnal dyspnea, orthopnea.    When compared with what she could do a few years ago she, patient reported being quite limited. About a year and a half ago she was able to do everything she needed to do  in a day.   Of note she has good dental health with upper and lower dentures.   Patient was seen on 3/1 for a right and left heart catheterization as part of a workup for TAVR.   Hospital Course     Consultants: None    Nonrheumatic Aortic Valve Stenosis  Coronary Artery Disease  - Patient is currently being evaluated for TAVR. Patient was seen on 3/1 for Right and Left Heart Catheterization as part of the workup.  - Cardiac catheterization on 10/28/21- High-grade obtuse marginal treated with 1  drug-eluting stent and 60% left main treated with 1 drug-eluting stent with IVUS optimization.  There is mild to moderate disease of the LAD and PDA and a short chronic total occlusion of the mid left circumflex will be which will be treated medically, LVEDP 36 mmHg. Recommended DAPT and low dose diuretic at discharge  - Developed a grade 2 hematoma immediately after cath, continued to have significant bruising this AM that was unchanged compared to yesterday. Bruising includes the right wrist, hand, and forearm and is soft, nontender. Patient has good capillary refill and 2+ radial pulse. Patient able to move right wrist, hand, and fingers freely and without pain. Dr Ali Lowe has been following, evaluated prior to discharge. Bruising was stable and unchanged overnight, hematoma resolved.  - Continue daily ASA, plavix - Start carvedilol 6.25 daily  - Start lasix 40 mg daily  - Increase lipitor to 80 mg daily, will need LFTs and Lipid panel in 8 weeks  - Has follow up appointment with [facility] for PCI follow up and for BMP prior to cardiac CT scans   Did the patient have an acute coronary syndrome (MI, NSTEMI, STEMI, etc) this admission?:  No                               Did the patient have a percutaneous coronary intervention (stent / angioplasty)?:  Yes.     Cath/PCI Registry Performance & Quality Measures: Aspirin prescribed? - Yes ADP Receptor Inhibitor (Plavix/Clopidogrel, Brilinta/Ticagrelor or Effient/Prasugrel) prescribed (includes medically managed patients)? - Yes High Intensity Statin (Lipitor 40-80mg  or Crestor 20-40mg ) prescribed? - Yes For EF <40%, was ACEI/ARB prescribed? - Not Applicable (EF >/= [id number]) For EF <40%, Aldosterone Antagonist (Spironolactone or Eplerenone) prescribed? - Not Applicable (EF >/= [id number]) Cardiac Rehab Phase II ordered? - Yes     Patient has an appointment on 3/13 with structural heart APP   Patient was seen and evaluated by Dr. Ali Lowe and Dr.  Claiborne Billings and deemed stable for discharge.   _____________  Discharge Vitals Blood pressure (!) 127/58, pulse 92, temperature (!) 97.5 F (36.4 C), temperature source Oral, resp. rate (!) 30, height 5' (1.524 m), weight 49.9 kg, SpO2 96 %.  Filed Weights   10/28/21 0724  Weight: 49.9 kg    Labs & Radiologic Studies    CBC Recent Labs    10/28/21 1057 10/29/21 0246  WBC  --  6.3  HGB 9.5* 9.3*  HCT 28.0* 28.9*  MCV  --  88.7  PLT  --  [id number]   Basic Metabolic Panel Recent Labs    10/28/21 1057 10/29/21 0246  NA 139 134*  K 4.3 4.3  CL  --  102  CO2  --  23  GLUCOSE  --  95  BUN  --  23  CREATININE  --  1.45*  CALCIUM  --  8.6*  Liver Function Tests No results for input(s): AST, ALT, ALKPHOS, BILITOT, PROT, ALBUMIN in the last 72 hours. No results for input(s): LIPASE, AMYLASE in the last 72 hours. High Sensitivity Troponin:   No results for input(s): TROPONINIHS in the last 720 hours.  BNP Invalid input(s): POCBNP D-Dimer No results for input(s): DDIMER in the last 72 hours. Hemoglobin A1C No results for input(s): HGBA1C in the last 72 hours. Fasting Lipid Panel No results for input(s): CHOL, HDL, LDLCALC, TRIG, CHOLHDL, LDLDIRECT in the last 72 hours. Thyroid Function Tests No results for input(s): TSH, T4TOTAL, T3FREE, THYROIDAB in the last 72 hours.  Invalid input(s): FREET3 _____________  CARDIAC CATHETERIZATION  Result Date: 10/28/2021   Ost LM to Mid LM lesion is 60% stenosed.   RPDA lesion is 50% stenosed.   Prox LAD lesion is 40% stenosed.   Mid LAD-1 lesion is 60% stenosed.   Mid LAD-2 lesion is 50% stenosed.   1st Mrg lesion is 80% stenosed.   Dist Cx lesion is 99% stenosed.   A stent was successfully placed.   A stent was successfully placed.   Post intervention, there is a 0% residual stenosis.   Post intervention, there is a 0% residual stenosis.   LV end diastolic pressure is severely elevated.   The left ventricular ejection fraction is 50-55% by  visual estimate. 1.  High-grade obtuse marginal treated with 1 drug-eluting stent and 60% left main treated with 1 drug-eluting stent with IVUS optimization.  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 2.  Cardiac output of 3.7 to 2 L/min and index of 2.5 L/min/m with the following hemodynamics: Right atrial pressure 16/13 with a mean of 12 mmHg Right ventricular pressure 56/9 with a RV end-diastolic pressure of 21 mmHg Wedge pressure 26/30 with a mean of 22 mmHg PA pressure 55/22 with a mean of 34 mmHg; PAPI 2.5 LVEDP 14mmHg Recommendations: Dual antiplatelet therapy and consider low-dose diuretics on discharge.   Disposition   Pt is being discharged home today in good condition.  Follow-up Plans & Appointments     Follow-up Information     [facility] Follow up on 11/09/2021.   Specialty: Cardiology Why: Appointment at 12:30 Contact information: [address] [place] [phone]               Discharge Instructions     Amb Referral to Cardiac Rehabilitation   Complete by: As directed    Diagnosis: Coronary Stents   After initial evaluation and assessments completed: Virtual Based Care may be provided alone or in conjunction with Phase 2 Cardiac Rehab based on patient barriers.: Yes       Discharge Medications   Allergies as of 10/29/2021   No Known Allergies      Medication List     TAKE these medications    acetaminophen 500 MG tablet Commonly known as: TYLENOL Take 500 mg by mouth daily as needed for headache.   ALPRAZolam 0.25 MG tablet Commonly known as: XANAX Take 0.5 tablets by mouth at bedtime. For anxiety   aspirin 81 MG chewable tablet Chew 1 tablet (81 mg total) by mouth daily. Start taking on: October 30, 2021   atorvastatin 80 MG tablet Commonly known as: LIPITOR Take 1  tablet (80 mg total) by mouth daily. What changed:  medication strength  how much to take   carvedilol 6.25 MG tablet Commonly known as: COREG Take 1 tablet (6.25 mg total) by mouth 2 (two) times daily with a meal.   clopidogrel 75 MG tablet Commonly known as: PLAVIX Take 1 tablet (75 mg total) by mouth daily with breakfast. Start taking on: October 30, 2021   dorzolamide-timolol 22.3-6.8 MG/ML ophthalmic solution Commonly known as: COSOPT Place 1 drop into the right eye at bedtime.   fluvoxaMINE 100 MG tablet Commonly known as: LUVOX Take 2 tablets (200 mg total) by mouth at bedtime.   furosemide 40 MG tablet Commonly known as: LASIX Take 1 tablet (40 mg total) by mouth daily.   latanoprost 0.005 % ophthalmic solution Commonly known as: XALATAN Place 1 drop into both eyes at bedtime.   multivitamin with minerals Tabs tablet Take 1 tablet by mouth daily.   nitroGLYCERIN 0.4 MG SL tablet Commonly known as: NITROSTAT Place 1 tablet (0.4 mg total) under the tongue every 5 (five) minutes as needed for chest pain.   Synthroid 88 MCG tablet Generic drug: levothyroxine Take 88 mcg by mouth daily.           Outstanding Labs/Studies   BMP at follow up on 3/13  LFTs and lipid panel in 8 weeks  Coronary CT as part of TAVR workup   Duration of Discharge Encounter   Greater than 30 minutes including physician time.  Signed, Margie Billet, PA-C 10/29/2021, 11:41 AM

## 2021-10-29 NOTE — Progress Notes (Addendum)
? ?Progress Note ? ?Patient Name: Sharon Campbell ?Date of Encounter: 10/29/2021 ? ?[facility] Cardiologist: Pixie Casino, MD  ? ?Subjective  ? ?Patient denies any chest pain, palpitations, SOB. Reports that she had noticed some SOB at home before she came to the hospital. [facility] with cardiac rehab and did well.  ? ?Patient has significant bruising in her right wrist/hand. Patient developed a level 2 hematoma in right wrist/hand shortly after her cath yesterday. Reports some tenderness near cath site.  ? ?Inpatient Medications  ?  ?Scheduled Meds: ? aspirin  81 mg Oral Daily  ? atorvastatin  80 mg Oral Daily  ? carvedilol  6.25 mg Oral BID WC  ? clopidogrel  75 mg Oral Q breakfast  ? furosemide  40 mg Oral Daily  ? sodium chloride flush  3 mL Intravenous Q12H  ? sodium chloride flush  3 mL Intravenous Q12H  ? ?Continuous Infusions: ? sodium chloride    ? ?PRN Meds: ?sodium chloride, acetaminophen, nitroGLYCERIN, ondansetron (ZOFRAN) IV, sodium chloride flush  ? ?Vital Signs  ?  ?Vitals:  ? 10/28/21 1935 10/29/21 0029 10/29/21 0445 10/29/21 0805  ?BP: (!) 119/58 112/60 121/65 (!) 127/58  ?Pulse: (!) 106 85 80 95  ?Resp: 18   (!) 30  ?Temp: (!) 97.5 ?F (36.4 ?C) 98.6 ?F (37 ?C) 97.6 ?F (36.4 ?C) (!) 97.5 ?F (36.4 ?C)  ?TempSrc: Oral Oral Oral Oral  ?SpO2: 95% 96% 98% 96%  ?Weight:      ?Height:      ? ? ?Intake/Output Summary (Last 24 hours) at 10/29/2021 1111 ?Last data filed at 10/28/2021 2038 ?Gross per 24 hour  ?Intake 240 ml  ?Output 200 ml  ?Net 40 ml  ? ?Last 3 Weights 10/28/2021 10/13/2021 10/09/2021  ?Weight (lbs) 110 lb 110 lb 12.8 oz 112 lb 6.4 oz  ?Weight (kg) 49.896 kg 50.259 kg 50.984 kg  ?   ? ?Telemetry  ?  ?Sinus rhythm, occasionally tachycardic to the low 100s - Personally Reviewed ? ?ECG  ?  ?Normal sinus rhythm, inverted T waves in the lateral leads  - Personally Reviewed ? ?Physical Exam  ? ?GEN: No acute distress.   ?Neck: No JVD ?Cardiac: RRR, Grade 3/6 systolic murmur, heard best at  LUSB ?Respiratory: Clear to auscultation bilaterally. ?GI: Soft, nontender, non-distended  ?MS: No edema. There is significant bruising near the right radial cath site. Bruising includes the right wrist, forearm, and hand. Is soft, tender to palpation. Nailbeds are pink, good capillary refill. Right radial pulse is 2+. Patient able to move wrist, hand, and fingers without pain.  ?Neuro:  Nonfocal  ?Psych: Normal affect  ? ?Labs  ?  ?High Sensitivity Troponin:  No results for input(s): TROPONINIHS in the last 720 hours.   ?Chemistry ?Recent Labs  ?Lab 10/28/21 ?1054 10/28/21 ?1057 10/29/21 ?0246  ?NA 139 139 134*  ?K 4.4 4.3 4.3  ?CL  --   --  102  ?CO2  --   --  23  ?GLUCOSE  --   --  95  ?BUN  --   --  23  ?CREATININE  --   --  1.45*  ?CALCIUM  --   --  8.6*  ?GFRNONAA  --   --  68*  ?ANIONGAP  --   --  9  ?  ?Lipids No results for input(s): CHOL, TRIG, HDL, LABVLDL, LDLCALC, CHOLHDL in the last 168 hours.  ?Hematology ?Recent Labs  ?Lab 10/28/21 ?1054 10/28/21 ?1057 10/29/21 ?0246  ?  WBC  --   --  6.3  ?RBC  --   --  3.26*  ?HGB 9.5* 9.5* 9.3*  ?HCT 28.0* 28.0* 28.9*  ?MCV  --   --  88.7  ?MCH  --   --  28.5  ?MCHC  --   --  32.2  ?RDW  --   --  14.2  ?PLT  --   --  200  ? ?Thyroid No results for input(s): TSH, FREET4 in the last 168 hours.  ?BNPNo results for input(s): BNP, PROBNP in the last 168 hours.  ?DDimer No results for input(s): DDIMER in the last 168 hours.  ? ?Radiology  ?  ?CARDIAC CATHETERIZATION ? ?Result Date: 10/28/2021 ?  Ost LM to Mid LM lesion is 60% stenosed.   RPDA lesion is 50% stenosed.   Prox LAD lesion is 40% stenosed.   Mid LAD-1 lesion is 60% stenosed.   Mid LAD-2 lesion is 50% stenosed.   1st Mrg lesion is 80% stenosed.   Dist Cx lesion is 99% stenosed.   A stent was successfully placed.   A stent was successfully placed.   Post intervention, there is a 0% residual stenosis.   Post intervention, there is a 0% residual stenosis.   LV end diastolic pressure is severely elevated.   The left  ventricular ejection fraction is 50-55% by visual estimate. 1.  High-grade obtuse marginal treated with 1 drug-eluting stent and 60% left main treated with 1 drug-eluting stent with IVUS optimization.  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 2.  Cardiac output of 3.7 to 2 L/min and index of 2.5 L/min/m? with the following hemodynamics: Right atrial pressure 16/13 with a mean of 12 mmHg Right ventricular pressure 56/9 with a RV end-diastolic pressure of 21 mmHg Wedge pressure 26/30 with a mean of 22 mmHg PA pressure 55/22 with a mean of 34 mmHg; PAPI 2.5 LVEDP 8mmHg Recommendations: Dual antiplatelet therapy and consider low-dose diuretics on discharge.   ? ?Cardiac Studies  ? ?Right and Left Heart Cath on 10/28/21  ?  Ost LM to Mid LM lesion is 60% stenosed. ?  RPDA lesion is 50% stenosed. ?  Prox LAD lesion is 40% stenosed. ?  Mid LAD-1 lesion is 60% stenosed. ?  Mid LAD-2 lesion is 50% stenosed. ?  1st Mrg lesion is 80% stenosed. ?  Dist Cx lesion is 99% stenosed. ?  A stent was successfully placed. ?  A stent was successfully placed. ?  Post intervention, there is a 0% residual stenosis. ?  Post intervention, there is a 0% residual stenosis. ?  LV end diastolic pressure is severely elevated. ?  The left ventricular ejection fraction is 50-55% by visual estimate. ?  ?1.  High-grade obtuse marginal treated with 1 drug-eluting stent and 60% left main treated with 1 drug-eluting stent with IVUS optimization.  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 ?2.  Cardiac output of 3.7 to 2 L/min and index of 2.5 L/min/m? with the following hemodynamics: ?  ?Right atrial pressure 16/13 with a mean of 12 mmHg ?Right ventricular pressure 56/9 with a RV end-diastolic pressure of 21 mmHg ?Wedge pressure  26/30 with a mean of 22 mmHg ?PA pressure 55/22 with a mean  of 34 mmHg; PAPI 2.5 ?LVEDP 29mmHg ?  ?Recommendations: Dual antiplatelet therapy and consider low-dose diuretics on discharge. ? Diagnostic ?Dominance: Right ?Intervention ? ? ? ?Patient Profile  ?   ?87 y.o. female is a PMH of severe aortic stenosis, HTN, HLD, CKD who was seen on 3/1 for Right and Left Heart Catheterization as part of a TAVR workup.  ? ?Assessment & Plan  ?  ?Nonrheumatic Aortic Valve Stenosis  ?Coronary Artery Disease  ?- Patient is currently being evaluated for TAVR. Patient was seen on 3/1 for Right and Left Heart Catheterization as part of the workup.  ?- Cardiac catheterization on 10/28/21- High-grade obtuse marginal treated with 1 drug-eluting stent and 60% left main treated with 1 drug-eluting stent with IVUS optimization.  There is mild to moderate disease of the LAD and PDA and a short chronic total occlusion of the mid left circumflex will be which will be treated medically, LVEDP 36 mmHg. Recommended DAPT and low dose diuretic at discharge  ?- Developed a grade 2 hematoma immediately after cath, continued to have significant bruising this AM. Bruising includes the right wrist, hand, and forearm and is soft, nontender. Patient has good capillary refill and 2+ radial pulses.  ?- Continue ASA, plavix ?- Start carvedilol 6.25 mg BID ?- Start lasix 40 mg daily  ?- Increase lipitor to 80 mg daily, will need LFTs and Lipid panel in 8 weeks  ?- Has been evaluated by cardiac rehab, patient was able to walk well in the hallway.  ?- Has follow up appointment with [facility] for PCI follow up and for BMP prior to cardiac CT scans  ?  ?   ? ?For questions or updates, please contact [place] ?Please consult [email or web address] for contact info under  ? ?  ?   ?Signed, ?Margie Billet, PA-C  ?10/29/2021, 11:11 AM   ? ? ?Patient seen and examined. Agree with assessment and plan. Feels well, no recurrent chest pain or dyspnea.   Ecchymosis hematoma R radial sit. Moves hand ; no parathesias. Dr,. Thukkani saw and felt stable since cath yesterday.  Will dc today ? ? ?Troy Sine, MD, FACC ?10/29/2021 ?11:41 AM  ? ?

## 2021-11-03 ENCOUNTER — Telehealth (HOSPITAL_COMMUNITY): Payer: Self-pay

## 2021-11-03 NOTE — Telephone Encounter (Signed)
Pt insurance is active and benefits verified through [facility] Medicare Co-pay 0, DED 0/0 met, out of pocket $3,600/$355 met, co-insurance 0%. no pre-authorization required. Passport, 11/03/2021@9 :01am, REF# [id number] ? ?Will contact patient to see if she is interested in the Cardiac Rehab Program. If interested, patient will need to complete follow up appt. Once completed, patient will be contacted for scheduling upon review by the RN Navigator. ?

## 2021-11-05 ENCOUNTER — Other Ambulatory Visit: Payer: Self-pay

## 2021-11-05 NOTE — Progress Notes (Signed)
HEART AND [place]                                     Cardiology Office Note:    Date:  11/09/2021   ID:  Sharon Campbell, DOB 07/07/1935, MRN [id number]  PCP:  [name], MD  [facility] Cardiologist:  Pixie Casino, MD   Referring MD: [name], MD   Chief Complaint  Patient presents with   Follow-up    Post hospital f/u   History of Present Illness:    Sharon Campbell is a 87 y.o. female with a hx of severe aortic stenosis, hypertension, hyperlipidemia, and chronic kidney disease who was referred to Dr. Ali Lowe for the evaluation of severe aortic stenosis and possible TAVR.   She had recently been hospitalized for presyncope and found to have severe aortic stenosis and moderate aortic insufficiency. She was stable and discharged home with plan for further OP TAVR workup. She presented for planned [facility]  3/1and was found to have high-grade obtuse marginal treated with 1 drug-eluting stent and 60% left main treated with 1 drug-eluting stent with IVUS optimization. 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  Recommendation was for DAPT and low dose diuretic at discharge. She also developed a grade 2 hematoma of the right wrist immediately after cath which was stabilized by discharge.   She presents today for follow up and states that she has been very fatigued since discharge, sleeping up to 16 hours per day with reports of black stools on two occasions. She has had intermittent SOB with exertion which is also new since before stent placement.Marland Kitchen Hb from 10/29/21 with a Hb at 9.2. As above she was placed on DAPT with ASA and Plavix after stent placement. I worry about a GI bleed. We discussed obtaining labs today and will call with results. I am anticipating that she will need a transfusion. She may be better  served with a hospital admission if anemia is severe enough. Will need to discuss plan with Dr. Ali Lowe (DAPT). She denies chest pain, palpitations, LE edema, dizziness, falls, or syncope.   Past Medical History:  Diagnosis Date   Glaucoma    High cholesterol    Hypertension    Thyroid disease     Past Surgical History:  Procedure Laterality Date   CESAREAN SECTION     x 2   CORONARY STENT INTERVENTION N/A 10/28/2021   Procedure: CORONARY STENT INTERVENTION;  Surgeon: Early Osmond, MD;  Location: [place] CV [facility];  Service: Cardiovascular;  Laterality: N/A;   INTRAVASCULAR ULTRASOUND/IVUS N/A 10/28/2021   Procedure: Intravascular Ultrasound/IVUS;  Surgeon: Early Osmond, MD;  Location: [place] [facility];  Service: Cardiovascular;  Laterality: N/A;   RIGHT/LEFT HEART CATH AND CORONARY ANGIOGRAPHY N/A 10/28/2021   Procedure: RIGHT/LEFT HEART CATH AND CORONARY ANGIOGRAPHY;  Surgeon: Early Osmond, MD;  Location: [place] [facility];  Service: Cardiovascular;  Laterality: N/A;    Current Medications: Current Meds  Medication Sig   acetaminophen (TYLENOL) 500 MG tablet Take 500 mg by mouth daily as needed for headache.   ALPRAZolam (XANAX) 0.25 MG tablet Take 0.5 tablets by mouth at bedtime. For anxiety   aspirin 81 MG chewable tablet Chew 1 tablet (81 mg  total) by mouth daily.   atorvastatin (LIPITOR) 80 MG tablet Take 1 tablet (80 mg total) by mouth daily.   carvedilol (COREG) 6.25 MG tablet Take 1 tablet (6.25 mg total) by mouth 2 (two) times daily with a meal.   clopidogrel (PLAVIX) 75 MG tablet Take 1 tablet (75 mg total) by mouth daily with breakfast.   dorzolamide-timolol (COSOPT) 22.3-6.8 MG/ML ophthalmic solution Place 1 drop into the right eye at bedtime.   fluvoxaMINE (LUVOX) 100 MG tablet Take 2 tablets (200 mg total) by mouth at bedtime.   furosemide (LASIX) 40 MG tablet Take 1 tablet (40 mg total) by mouth daily.   latanoprost (XALATAN) 0.005 % ophthalmic solution  Place 1 drop into both eyes at bedtime.    Multiple Vitamin (MULTIVITAMIN WITH MINERALS) TABS Take 1 tablet by mouth daily.   nitroGLYCERIN (NITROSTAT) 0.4 MG SL tablet Place 1 tablet (0.4 mg total) under the tongue every 5 (five) minutes as needed for chest pain.   SYNTHROID 88 MCG tablet Take 88 mcg by mouth daily.      Allergies:   Patient has no known allergies.   Social History   Socioeconomic History   Marital status: Married    Spouse name: Not on file   Number of children: Not on file   Years of education: Not on file   Highest education level: Not on file  Occupational History   Not on file  Tobacco Use   Smoking status: Former    Types: Cigarettes   Smokeless tobacco: Never   Tobacco comments:    Quit around 36  Substance and Sexual Activity   Alcohol use: Yes    Comment: rarely   Drug use: Never   Sexual activity: Not on file  Other Topics Concern   Not on file  Social History Narrative   Not on file   Social Determinants of Health   Financial Resource Strain: Not on file  Food Insecurity: Not on file  Transportation Needs: Not on file  Physical Activity: Not on file  Stress: Not on file  Social Connections: Not on file     Family History: The patient's family history includes Cancer in her father; Hyperlipidemia in her mother.  ROS:   Please see the history of present illness.    All other systems reviewed and are negative.  EKGs/Labs/Other Studies Reviewed:    The following studies were reviewed today:  Right and Left Heart Catheterization 10/28/21    Ost LM to Mid LM lesion is 60% stenosed.   RPDA lesion is 50% stenosed.   Prox LAD lesion is 40% stenosed.   Mid LAD-1 lesion is 60% stenosed.   Mid LAD-2 lesion is 50% stenosed.   1st Mrg lesion is 80% stenosed.   Dist Cx lesion is 99% stenosed.   A stent was successfully placed.   A stent was successfully placed.   Post intervention, there is a 0% residual stenosis.   Post intervention,  there is a 0% residual stenosis.   LV end diastolic pressure is severely elevated.   The left ventricular ejection fraction is 50-55% by visual estimate.   1.  High-grade obtuse marginal treated with 1 drug-eluting stent and 60% left main treated with 1 drug-eluting stent with IVUS optimization.  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 2.  Cardiac output of 3.7 to 2 L/min  and index of 2.5 L/min/m with the following hemodynamics:   Right atrial pressure 16/13 with a mean of 12 mmHg Right ventricular pressure 56/9 with a RV end-diastolic pressure of 21 mmHg Wedge pressure 26/30 with a mean of 22 mmHg PA pressure 55/22 with a mean of 34 mmHg; PAPI 2.5 LVEDP 41mmHg   Recommendations: Dual antiplatelet therapy and consider low-dose diuretics on discharge.   Diagnostic Dominance: Right Intervention      Echocardiogram 09/07/21:   1. Left ventricular ejection fraction, by estimation, is 60 to 65%. The  left ventricle has normal function. The left ventricle has no regional  wall motion abnormalities. There is moderate asymmetric left ventricular  hypertrophy of the basal-septal  segment. Left ventricular diastolic parameters are consistent with Grade  II diastolic dysfunction (pseudonormalization). Elevated left atrial  pressure.   2. Right ventricular systolic function is normal. The right ventricular  size is normal. There is mildly elevated pulmonary artery systolic  pressure. The estimated right ventricular systolic pressure is 123XX123 mmHg.   3. Left atrial size was moderately dilated.   4. The mitral valve is normal in structure. Mild to moderate mitral valve  regurgitation.   5. The inferior vena cava is normal in size with greater than 50%  respiratory variability, suggesting right atrial pressure of 3 mmHg.   6. The aortic valve was not well visualized. There is  severe calcifcation  of the aortic valve. Aortic valve regurgitation is moderate. Severe aortic  valve stenosis. Vmax 4.9 m/s, MG 33mmHg, AVA 0.6 cm^2, DI 0.20   EKG:  EKG is not ordered today.    Recent Labs: 09/08/2021: ALT 20 10/29/2021: BUN 23; Creatinine, Ser 1.45; Hemoglobin 9.3; Platelets 200; Potassium 4.3; Sodium 134  Recent Lipid Panel No results found for: CHOL, TRIG, HDL, CHOLHDL, VLDL, LDLCALC, LDLDIRECT  Physical Exam:    VS:  BP 100/60    Pulse 66    Ht 5' (1.524 m)    Wt 108 lb (49 kg)    SpO2 99%    BMI 21.09 kg/m     Wt Readings from Last 3 Encounters:  11/09/21 108 lb (49 kg)  10/28/21 110 lb (49.9 kg)  10/13/21 110 lb 12.8 oz (50.3 kg)    General: Well developed, well nourished, NAD Neck: Negative for carotid bruits. No JVD Lungs:Clear to ausculation bilaterally. No wheezes, rales, or rhonchi. Breathing is unlabored. Cardiovascular: RRR with S1 S2. Harsh systolic murmur Extremities: No edema. Neuro: Alert and oriented. No focal deficits. No facial asymmetry. MAE spontaneously. Psych: Responds to questions appropriately with normal affect.    ASSESSMENT/PLAN:    Severe AS: She has known severe AS with mean gradient at 55.58mmHg, peak at 92.23mmHg and a AVA by VTI at 0.63cm2. She presented for [facility] which showed high-grade obtuse marginal treated with 1 drug-eluting stent and 60% left main treated with 1 drug-eluting stent with IVUS optimization. 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 Recommendation was for DAPT with ASA and Plavix. On today's exam, she reports significant fatigue since discharge, sleeping up to 16 hours per day with reports of black stools on two occasions. She has had intermittent SOB with exertion which is also new since before stent placement. Hb from 10/29/21 with a Hb at 9.2.  I suspect that she has a GI bleed. We discussed obtaining  labs today and will call with  results. I am anticipating that she will need a transfusion. She may be better served with a hospital admission if anemia is severe. Will also discuss DAPT plan with Dr. Ali Lowe if in fact she has a GI bleed. Will hold off on re TAVR CT scans for now until current issues resolved. Plan discussed with patient and her son.   Suspected GI bleed: Patient reports two recent episodes of black stool since starting ASA and Plavix after PCI placement. Checking CBC, BMET today. May need OP transfusion however is critical or now OP appointment, will have her go to the ED for further workup including hemoccult stool sample.   Hypertension: Actually running low today at 100/60. No dizziness or syncope. Checking labs today.   Hyperlipidemia: Lipitor increased to 80 mg daily. Will need LFTs and Lipid panel in 8 weeks   Chronic kidney disease stage IIIb: Cr at hospital discharge, 1.45. Checking BMET today and will call with results. No anticipated changes.   Medication Adjustments/Labs and Tests Ordered: Current medicines are reviewed at length with the patient today.  Concerns regarding medicines are outlined above.  Orders Placed This Encounter  Procedures   Basic metabolic panel   CBC   No orders of the defined types were placed in this encounter.   Patient Instructions  Medication Instructions:  Your physician recommends that you continue on your current medications as directed. Please refer to the Current Medication list given to you today.  *If you need a refill on your cardiac medications before your next appointment, please call your pharmacy*   Lab Work: TODAY: BMET, CBC If you have labs (blood work) drawn today and your tests are completely normal, you will receive your results only by: [place] (if you have MyChart) OR A paper copy in the mail If you have any lab test that is abnormal or we need to change your treatment, we will call you to review the  results.   Testing/Procedures: NONE   Follow-Up: At [facility], you and your health needs are our priority.  As part of our continuing mission to provide you with exceptional heart care, we have created designated Provider Care Teams.  These Care Teams include your primary Cardiologist (physician) and Advanced Practice Providers (APPs -  Physician Assistants and Nurse Practitioners) who all work together to provide you with the care you need, when you need it.  We recommend signing up for the patient portal called "MyChart".  Sign up information is provided on this After Visit Summary.  MyChart is used to connect with patients for Virtual Visits (Telemedicine).  Patients are able to view lab/test results, encounter notes, upcoming appointments, etc.  Non-urgent messages can be sent to your provider as well.   To learn more about what you can do with MyChart, go to [email or web address].    Your next appointment:   FOLLOW UP BASED ON LAB RESULTS    Signed, [name], NP  11/09/2021 1:19 PM    [facility]

## 2021-11-06 ENCOUNTER — Telehealth (HOSPITAL_COMMUNITY): Payer: Self-pay

## 2021-11-06 ENCOUNTER — Other Ambulatory Visit (HOSPITAL_COMMUNITY): Payer: Self-pay

## 2021-11-06 NOTE — Telephone Encounter (Signed)
Transitions of Care Pharmacy   Call attempted for a pharmacy transitions of care follow-up. HIPAA appropriate voicemail was left with call back information provided.   Call attempt #2. Will follow-up in 2-3 days.    

## 2021-11-09 ENCOUNTER — Ambulatory Visit (INDEPENDENT_AMBULATORY_CARE_PROVIDER_SITE_OTHER): Payer: Medicare Other | Admitting: Cardiology

## 2021-11-09 ENCOUNTER — Other Ambulatory Visit: Payer: Self-pay

## 2021-11-09 VITALS — BP 100/60 | HR 66 | Ht 60.0 in | Wt 108.0 lb

## 2021-11-09 DIAGNOSIS — I25118 Atherosclerotic heart disease of native coronary artery with other forms of angina pectoris: Secondary | ICD-10-CM | POA: Diagnosis not present

## 2021-11-09 DIAGNOSIS — I350 Nonrheumatic aortic (valve) stenosis: Secondary | ICD-10-CM | POA: Diagnosis not present

## 2021-11-09 DIAGNOSIS — K921 Melena: Secondary | ICD-10-CM

## 2021-11-09 NOTE — Patient Instructions (Addendum)
Medication Instructions:  ?Your physician recommends that you continue on your current medications as directed. Please refer to the Current Medication list given to you today.  ?*If you need a refill on your cardiac medications before your next appointment, please call your pharmacy* ? ? ?Lab Work: ?TODAY: BMET, CBC ?If you have labs (blood work) drawn today and your tests are completely normal, you will receive your results only by: ?MyChart Message (if you have MyChart) OR ?A paper copy in the mail ?If you have any lab test that is abnormal or we need to change your treatment, we will call you to review the results. ? ? ?Testing/Procedures: ?NONE ? ? ?Follow-Up: ?At [facility], you and your health needs are our priority.  As part of our continuing mission to provide you with exceptional heart care, we have created designated Provider Care Teams.  These Care Teams include your primary Cardiologist (physician) and Advanced Practice Providers (APPs -  Physician Assistants and Nurse Practitioners) who all work together to provide you with the care you need, when you need it. ? ?We recommend signing up for the patient portal called "MyChart".  Sign up information is provided on this After Visit Summary.  MyChart is used to connect with patients for Virtual Visits (Telemedicine).  Patients are able to view lab/test results, encounter notes, upcoming appointments, etc.  Non-urgent messages can be sent to your provider as well.   ?To learn more about what you can do with MyChart, go to [email or web address].   ? ?Your next appointment:   ?FOLLOW UP BASED ON LAB RESULTS  ?

## 2021-11-10 ENCOUNTER — Telehealth (HOSPITAL_COMMUNITY): Payer: Self-pay | Admitting: Pharmacist

## 2021-11-10 ENCOUNTER — Telehealth: Payer: Self-pay | Admitting: Cardiology

## 2021-11-10 LAB — BASIC METABOLIC PANEL
BUN/Creatinine Ratio: 33 — ABNORMAL HIGH (ref 12–28)
BUN: 54 mg/dL — ABNORMAL HIGH (ref 8–27)
CO2: 26 mmol/L (ref 20–29)
Calcium: 9.5 mg/dL (ref 8.7–10.3)
Chloride: 96 mmol/L (ref 96–106)
Creatinine, Ser: 1.65 mg/dL — ABNORMAL HIGH (ref 0.57–1.00)
Glucose: 175 mg/dL — ABNORMAL HIGH (ref 70–99)
Potassium: 4.3 mmol/L (ref 3.5–5.2)
Sodium: 138 mmol/L (ref 134–144)
eGFR: 30 mL/min/{1.73_m2} — ABNORMAL LOW (ref >59)

## 2021-11-10 LAB — CBC
Hematocrit: 28.2 % — ABNORMAL LOW (ref 34.0–46.6)
Hemoglobin: 9 g/dL — ABNORMAL LOW (ref 11.1–15.9)
MCH: 27.6 pg (ref 26.6–33.0)
MCHC: 31.9 g/dL (ref 31.5–35.7)
MCV: 87 fL (ref 79–97)
Platelets: 325 10*3/uL (ref 150–450)
RBC: 3.26 x10E6/uL — ABNORMAL LOW (ref 3.77–5.28)
RDW: 13.5 % (ref 11.7–15.4)
WBC: 8.6 10*3/uL (ref 3.4–10.8)

## 2021-11-10 NOTE — Telephone Encounter (Signed)
Labs returned from yesterdays visit which surprisingly show a stable Hb at 9.0. Unfortunately her Cr is elevated at 1.65 from 1.45. Called and spoke to the patients son and recommended that she hold her Lasix fdor one week. She has been scheduled for follow up with myself 11/23/21 at 1pm. I have also encouraged that she follow with her PCP due to stool changes as described in our visit. Will plan on repeat lab work at follow up.  ? ?Georgie Chard NP-C ?Structural Heart Team  ?Pager: [phone] ?Phone: [phone]  ?

## 2021-11-10 NOTE — Telephone Encounter (Signed)
Transitions of Care Pharmacy  ? ?Call attempted for a pharmacy transitions of care follow-up. HIPAA appropriate voicemail was left with call back information provided.  ? ?Call attempt #3. No further follow-up at this time.  ?

## 2021-11-20 NOTE — Progress Notes (Signed)
?HEART AND VASCULAR CENTER   ?[place] ?                                    ?Cardiology Office Note:   ? ?Date:  11/25/2021  ? ?ID:  Sharon Campbell, DOB 05/19/1935, MRN [id number] ? ?PCP:  [name], MD  ?[facility] Cardiologist:  Pixie Casino, MD  ?[facility] Electrophysiologist:  None  ? ?Referring MD: [name], MD  ? ?Chief Complaint  ?Patient presents with  ? Follow-up  ? Fatigue  ? ?History of Present Illness:   ? ?Sharon Campbell is a 87 y.o. female with a hx of severe aortic stenosis, hypertension, hyperlipidemia, and chronic kidney disease who was referred to Dr. Ali Lowe for the evaluation of severe aortic stenosis and possible TAVR.  ?  ?She had recently been hospitalized for presyncope and found to have severe aortic stenosis and moderate aortic insufficiency. She was stable and discharged home with plan for further OP TAVR workup. She presented for planned [facility]  3/1 and was found to have high-grade obtuse marginal treated with 1 drug-eluting stent and 60% left main treated with 1 drug-eluting stent with IVUS optimization. 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 Recommendation was for DAPT and low dose diuretic at discharge. She also developed a grade 2 hematoma of the right wrist immediately after cath which was stabilized by discharge.  ? ?She was seen by myself in follow up at which time she was not feeling great with complaints of overwhelming fatigue, sleeping up to 16 hours per day with reports of black stools on two occasions. She has had intermittent SOB with exertion which is also new since before stent placement.Marland Kitchen Hb from 10/29/21 with a Hb at 9.2. As above she was placed on DAPT with ASA and Plavix after stent placement. I was initially worried that she had a GI bleed however follow up labs appears relatively normal with Hb  at 9.0 which is around her baseline. I had her return today for follow up and she appears to be much worse. She comes in a wheelchair and has a large black eye/nose abrasion from falling out of the bed. Her son is here as well and states that the initial plan was for her to establish at [facility]. She went there for approximately one day and her son found her curled in the bed sleeping every time he would visit so he took her home. Her functional capacity has abruptly decreased due to fatigue and SOB. She can no longer go upstairs and has to be carried most places around the house. She is very poor appetite as well. She denies chest pain, orthopnea, dizziness, or syncope. Her fall was from weakness and not being able to get out of the bed.  ? ?Past Medical History:  ?Diagnosis Date  ? Glaucoma   ? High cholesterol   ? Hypertension   ? Thyroid disease   ? ? ?Past Surgical History:  ?Procedure Laterality Date  ? CESAREAN SECTION    ? x 2  ? CORONARY STENT INTERVENTION N/A 10/28/2021  ? Procedure: CORONARY STENT INTERVENTION;  Surgeon: Early Osmond, MD;  Location: [place] [facility];  Service: Cardiovascular;  Laterality: N/A;  ? INTRAVASCULAR ULTRASOUND/IVUS N/A 10/28/2021  ?  Procedure: Intravascular Ultrasound/IVUS;  Surgeon: Early Osmond, MD;  Location: [place] [facility];  Service: Cardiovascular;  Laterality: N/A;  ? RIGHT/LEFT HEART CATH AND CORONARY ANGIOGRAPHY N/A 10/28/2021  ? Procedure: RIGHT/LEFT HEART CATH AND CORONARY ANGIOGRAPHY;  Surgeon: Early Osmond, MD;  Location: [place] [facility];  Service: Cardiovascular;  Laterality: N/A;  ? ? ?Current Medications: ?Current Meds  ?Medication Sig  ? acetaminophen (TYLENOL) 500 MG tablet Take 500 mg by mouth daily as needed for headache.  ? ALPRAZolam (XANAX) 0.25 MG tablet Take 0.5 tablets by mouth at bedtime. For anxiety  ? aspirin 81 MG chewable tablet Chew 1 tablet (81 mg total) by mouth daily.  ? atorvastatin (LIPITOR) 80 MG tablet Take 1 tablet (80  mg total) by mouth daily.  ? carvedilol (COREG) 6.25 MG tablet Take 1 tablet (6.25 mg total) by mouth 2 (two) times daily with a meal.  ? clopidogrel (PLAVIX) 75 MG tablet Take 1 tablet (75 mg total) by mouth daily with breakfast.  ? dorzolamide-timolol (COSOPT) 22.3-6.8 MG/ML ophthalmic solution Place 1 drop into the right eye at bedtime.  ? fluvoxaMINE (LUVOX) 100 MG tablet Take 2 tablets (200 mg total) by mouth at bedtime.  ? furosemide (LASIX) 40 MG tablet Take 1 tablet (40 mg total) by mouth daily.  ? latanoprost (XALATAN) 0.005 % ophthalmic solution Place 1 drop into both eyes at bedtime.   ? Multiple Vitamin (MULTIVITAMIN WITH MINERALS) TABS Take 1 tablet by mouth daily.  ? nitroGLYCERIN (NITROSTAT) 0.4 MG SL tablet Place 1 tablet (0.4 mg total) under the tongue every 5 (five) minutes as needed for chest pain.  ? SYNTHROID 88 MCG tablet Take 88 mcg by mouth daily.   ?  ? ?Allergies:   Cyclobenzaprine, Hydrocodone-acetaminophen, and Prozac [fluoxetine]  ? ?Social History  ? ?Socioeconomic History  ? Marital status: Married  ?  Spouse name: Not on file  ? Number of children: Not on file  ? Years of education: Not on file  ? Highest education level: Not on file  ?Occupational History  ? Not on file  ?Tobacco Use  ? Smoking status: Former  ?  Types: Cigarettes  ? Smokeless tobacco: Never  ? Tobacco comments:  ?  Quit around 48  ?Substance and Sexual Activity  ? Alcohol use: Yes  ?  Comment: rarely  ? Drug use: Never  ? Sexual activity: Not on file  ?Other Topics Concern  ? Not on file  ?Social History Narrative  ? Not on file  ? ?Social Determinants of Health  ? ?Financial Resource Strain: Not on file  ?Food Insecurity: Not on file  ?Transportation Needs: Not on file  ?Physical Activity: Not on file  ?Stress: Not on file  ?Social Connections: Not on file  ?  ? ?Family History: ?The patient's family history includes Cancer in her father; Hyperlipidemia in her mother. ? ?ROS:   ?Please see the history of present  illness.    ?All other systems reviewed and are negative. ? ?EKGs/Labs/Other Studies Reviewed:   ? ?The following studies were reviewed today: ? ?Right and Left Heart Catheterization 10/28/21  ?  Ost LM to Mid LM lesion is 60% stenosed. ?  RPDA lesion is 50% stenosed. ?  Prox LAD lesion is 40% stenosed. ?  Mid LAD-1 lesion is 60% stenosed. ?  Mid LAD-2 lesion is 50% stenosed. ?  1st Mrg lesion is 80% stenosed. ?  Dist Cx lesion is 99% stenosed. ?  A stent was  successfully placed. ?  A stent was successfully placed. ?  Post intervention, there is a 0% residual stenosis. ?  Post intervention, there is a 0% residual stenosis. ?  LV end diastolic pressure is severely elevated. ?  The left ventricular ejection fraction is 50-55% by visual estimate. ?  ?1.  High-grade obtuse marginal treated with 1 drug-eluting stent and 60% left main treated with 1 drug-eluting stent with IVUS optimization.  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 ?2.  Cardiac output of 3.7 to 2 L/min and index of 2.5 L/min/m? with the following hemodynamics: ?  ?Right atrial pressure 16/13 with a mean of 12 mmHg ?Right ventricular pressure 56/9 with a RV end-diastolic pressure of 21 mmHg ?Wedge pressure 26/30 with a mean of 22 mmHg ?PA pressure 55/22 with a mean of 34 mmHg; PAPI 2.5 ?LVEDP 48mmHg ?  ?Recommendations: Dual antiplatelet therapy and consider low-dose diuretics on discharge. ?  ?Diagnostic ?Dominance: Right ?Intervention ?  ?  ?  ?Echocardiogram 09/07/21: ? ? 1. Left ventricular ejection fraction, by estimation, is 60 to 65%. The  ?left ventricle has normal function. The left ventricle has no regional  ?wall motion abnormalities. There is moderate asymmetric left ventricular  ?hypertrophy of the basal-septal  ?segment. Left ventricular diastolic parameters are consistent with Grade  ?II diastolic dysfunction  (pseudonormalization). Elevated left atrial  ?pressure.  ? 2. Right ventricular systolic function is normal. The right ventricular  ?size is normal. There is mildly elevated pulmonary artery systolic  ?pressure. The est

## 2021-11-21 ENCOUNTER — Emergency Department (HOSPITAL_COMMUNITY): Payer: Medicare Other

## 2021-11-21 ENCOUNTER — Other Ambulatory Visit: Payer: Self-pay

## 2021-11-21 ENCOUNTER — Encounter (HOSPITAL_COMMUNITY): Payer: Self-pay | Admitting: Emergency Medicine

## 2021-11-21 ENCOUNTER — Emergency Department (HOSPITAL_COMMUNITY)
Admission: EM | Admit: 2021-11-21 | Discharge: 2021-11-21 | Disposition: A | Discharge status: Skilled Nursing Facility | Payer: Medicare Other | Attending: Emergency Medicine | Admitting: Emergency Medicine

## 2021-11-21 DIAGNOSIS — Z7902 Long term (current) use of antithrombotics/antiplatelets: Secondary | ICD-10-CM | POA: Diagnosis not present

## 2021-11-21 DIAGNOSIS — Z79899 Other long term (current) drug therapy: Secondary | ICD-10-CM | POA: Diagnosis not present

## 2021-11-21 DIAGNOSIS — R519 Headache, unspecified: Secondary | ICD-10-CM | POA: Diagnosis not present

## 2021-11-21 DIAGNOSIS — W06XXXA Fall from bed, initial encounter: Secondary | ICD-10-CM | POA: Insufficient documentation

## 2021-11-21 DIAGNOSIS — I517 Cardiomegaly: Secondary | ICD-10-CM | POA: Diagnosis not present

## 2021-11-21 DIAGNOSIS — I10 Essential (primary) hypertension: Secondary | ICD-10-CM | POA: Insufficient documentation

## 2021-11-21 DIAGNOSIS — S0990XA Unspecified injury of head, initial encounter: Secondary | ICD-10-CM | POA: Diagnosis not present

## 2021-11-21 DIAGNOSIS — Z23 Encounter for immunization: Secondary | ICD-10-CM | POA: Diagnosis not present

## 2021-11-21 DIAGNOSIS — R220 Localized swelling, mass and lump, head: Secondary | ICD-10-CM | POA: Diagnosis not present

## 2021-11-21 DIAGNOSIS — Z043 Encounter for examination and observation following other accident: Secondary | ICD-10-CM | POA: Diagnosis not present

## 2021-11-21 DIAGNOSIS — S0121XA Laceration without foreign body of nose, initial encounter: Secondary | ICD-10-CM | POA: Diagnosis not present

## 2021-11-21 DIAGNOSIS — I2510 Atherosclerotic heart disease of native coronary artery without angina pectoris: Secondary | ICD-10-CM | POA: Diagnosis not present

## 2021-11-21 DIAGNOSIS — Z7982 Long term (current) use of aspirin: Secondary | ICD-10-CM | POA: Insufficient documentation

## 2021-11-21 DIAGNOSIS — W19XXXA Unspecified fall, initial encounter: Secondary | ICD-10-CM | POA: Diagnosis not present

## 2021-11-21 DIAGNOSIS — R58 Hemorrhage, not elsewhere classified: Secondary | ICD-10-CM | POA: Diagnosis not present

## 2021-11-21 DIAGNOSIS — Y92129 Unspecified place in nursing home as the place of occurrence of the external cause: Secondary | ICD-10-CM | POA: Insufficient documentation

## 2021-11-21 DIAGNOSIS — Z743 Need for continuous supervision: Secondary | ICD-10-CM | POA: Diagnosis not present

## 2021-11-21 DIAGNOSIS — S0992XA Unspecified injury of nose, initial encounter: Secondary | ICD-10-CM | POA: Diagnosis present

## 2021-11-21 DIAGNOSIS — R6889 Other general symptoms and signs: Secondary | ICD-10-CM | POA: Diagnosis not present

## 2021-11-21 DIAGNOSIS — J9811 Atelectasis: Secondary | ICD-10-CM | POA: Diagnosis not present

## 2021-11-21 MED ORDER — TETANUS-DIPHTH-ACELL PERTUSSIS 5-2.5-18.5 LF-MCG/0.5 IM SUSY
0.5000 mL | PREFILLED_SYRINGE | Freq: Once | INTRAMUSCULAR | Status: AC
  Administered 2021-11-21: 0.5 mL via INTRAMUSCULAR
  Filled 2021-11-21: qty 0.5

## 2021-11-21 MED ORDER — ACETAMINOPHEN 500 MG PO TABS
1000.0000 mg | ORAL_TABLET | Freq: Once | ORAL | Status: AC
  Administered 2021-11-21: 1000 mg via ORAL
  Filled 2021-11-21: qty 2

## 2021-11-21 NOTE — ED Provider Notes (Signed)
?[place] ?Provider Note ? ? ?CSN: [id number] ?Arrival date & time: 11/21/21  1115 ? ?  ? ?History ? ?Chief Complaint  ?Patient presents with  ? Fall  ?  Level II  ? ? ?Sharon Campbell is a 87 y.o. female. ? ?HPI ? ?87 year old female with past medical history of HTN, HLD, CAD status post cath on Plavix presents emergency department as a level 2 trauma for fall on blood thinners.  [facility].  She was reportedly sleeping near the edge of her bed when she rolled off and hit her head on the end table.  Patient has a noted small laceration and deformity to the nasal bridge.  C-collar placed prior to arrival.  Patient states she has been in her usual state of health the past couple days.  Remembers going to bed and remembers rolling off the bed this morning and hitting her head.  Presumed loss of consciousness.  She is currently complaining of facial pain but denies any neck pain, back pain, chest pain, abdominal pain, pelvis pain or extremity pain. ? ?Home Medications ?Prior to Admission medications   ?Medication Sig Start Date End Date Taking? Authorizing Provider  ?acetaminophen (TYLENOL) 500 MG tablet Take 500 mg by mouth daily as needed for headache.    [provider]  ?ALPRAZolam Duanne Moron) 0.25 MG tablet Take 0.5 tablets by mouth at bedtime. For anxiety 06/01/12   [provider]  ?aspirin 81 MG chewable tablet Chew 1 tablet (81 mg total) by mouth daily. 10/30/21   Margie Billet, PA-C  ?atorvastatin (LIPITOR) 80 MG tablet Take 1 tablet (80 mg total) by mouth daily. 10/29/21   Margie Billet, PA-C  ?carvedilol (COREG) 6.25 MG tablet Take 1 tablet (6.25 mg total) by mouth 2 (two) times daily with a meal. 10/29/21   Margie Billet, PA-C  ?clopidogrel (PLAVIX) 75 MG tablet Take 1 tablet (75 mg total) by mouth daily with breakfast. 10/30/21   Margie Billet, PA-C  ?dorzolamide-timolol (COSOPT) 22.3-6.8 MG/ML ophthalmic solution Place 1  drop into the right eye at bedtime. 06/24/12   [provider]  ?fluvoxaMINE (LUVOX) 100 MG tablet Take 2 tablets (200 mg total) by mouth at bedtime. 09/08/21   Samuella Cota, MD  ?furosemide (LASIX) 40 MG tablet Take 1 tablet (40 mg total) by mouth daily. 10/29/21   Margie Billet, PA-C  ?latanoprost (XALATAN) 0.005 % ophthalmic solution Place 1 drop into both eyes at bedtime.  06/19/12   [provider]  ?Multiple Vitamin (MULTIVITAMIN WITH MINERALS) TABS Take 1 tablet by mouth daily.    [provider]  ?nitroGLYCERIN (NITROSTAT) 0.4 MG SL tablet Place 1 tablet (0.4 mg total) under the tongue every 5 (five) minutes as needed for chest pain. 10/29/21   Margie Billet, PA-C  ?SYNTHROID 88 MCG tablet Take 88 mcg by mouth daily.  06/12/12   [provider]  ?   ? ?Allergies    ?Patient has no known allergies.   ? ?Review of Systems   ?Review of Systems  ?Constitutional:  Negative for fever.  ?HENT:  Negative for trouble swallowing.   ?     + nose injury and bleeding  ?Respiratory:  Negative for shortness of breath.   ?Cardiovascular:  Negative for chest pain.  ?Gastrointestinal:  Negative for abdominal pain.  ?Musculoskeletal:  Negative for back pain and neck pain.  ?Skin:  Negative for rash.  ?Neurological:  Negative for headaches.  ? ?  Physical Exam ?Updated Vital Signs ?BP 118/60   Pulse 82   Temp 97.6 ?F (36.4 ?C) (Oral)   Resp (!) 22   Ht 5' (1.524 m)   Wt 49.9 kg   SpO2 94%   BMI 21.48 kg/m?  ?Physical Exam ?Vitals and nursing note reviewed.  ?Constitutional:   ?   General: She is not in acute distress. ?HENT:  ?   Head: Normocephalic.  ?   Comments: Midface is stable ?   Right Ear: Tympanic membrane and external ear normal.  ?   Left Ear: Tympanic membrane and external ear normal.  ?   Nose:  ?   Comments: No septal hematoma, small superficial laceration to the nasal bridge, dried blood surrounding but no active bleeding, swelling/slight deformity of the  nasal bridge, midface is stable ?   Mouth/Throat:  ?   Mouth: Mucous membranes are moist.  ?   Comments: No intra oral injury ?Eyes:  ?   Extraocular Movements: Extraocular movements intact.  ?   Conjunctiva/sclera: Conjunctivae normal.  ?   Pupils: Pupils are equal, round, and reactive to light.  ?Neck:  ?   Comments: Cervical collar in place ?Cardiovascular:  ?   Rate and Rhythm: Normal rate.  ?Pulmonary:  ?   Effort: Pulmonary effort is normal.  ?Abdominal:  ?   General: Abdomen is flat.  ?   Palpations: Abdomen is soft.  ?   Comments: No seat belt sign  ?Musculoskeletal:     ?   General: No deformity or signs of injury.  ?   Cervical back: No tenderness.  ?   Comments: Pelvis is stable, no tenderness  ?Skin: ?   General: Skin is warm.  ?Neurological:  ?   Mental Status: She is alert and oriented to person, place, and time.  ? ? ?ED Results / Procedures / Treatments   ?Labs ?(all labs ordered are listed, but only abnormal results are displayed) ?Labs Reviewed - No data to display ? ?EKG ?None ? ?Radiology ?DG Pelvis Portable ? ?Result Date: 11/21/2021 ?CLINICAL DATA:  Fall, pain EXAM: PORTABLE PELVIS 1-2 VIEWS COMPARISON:  None FINDINGS: Linear lucencies in the bilateral inferior pubic rami. On the left there appears to be degree of bone formation in this may be subacute-chronic. There is no evidence of femoral neck fracture on single frontal view. Lower lumbar spine degenerative changes. Mild bilateral hip osteoarthritis. IMPRESSION: Possible nondisplaced inferior pubic rami fractures, appearing subacute-chronic on the left, indeterminate on the right. No evidence of femoral neck fracture on single frontal view of the pelvis. Electronically Signed   By: Maurine Simmering M.D.   On: 11/21/2021 11:39  ? ?DG Chest Port 1 View ? ?Result Date: 11/21/2021 ?CLINICAL DATA:  Fall. EXAM: PORTABLE CHEST 1 VIEW COMPARISON:  09/06/2021 FINDINGS: Stable cardiac enlargement. Atelectasis at both lung bases. There is no evidence of  pulmonary edema, consolidation, pneumothorax or pleural fluid. IMPRESSION: Stable cardiomegaly.  Bibasilar atelectasis. Electronically Signed   By: Aletta Edouard M.D.   On: 11/21/2021 11:36   ? ?Procedures ?Marland KitchenCritical Care ?Performed by: Lorelle Gibbs, DO ?Authorized by: Lorelle Gibbs, DO  ? ?Critical care provider statement:  ?  Critical care time (minutes):  45 ?  Critical care time was exclusive of:  Separately billable procedures and treating other patients ?  Critical care was necessary to treat or prevent imminent or life-threatening deterioration of the following conditions:  Trauma ?  Critical care was time spent personally by  me on the following activities:  Development of treatment plan with patient or surrogate, discussions with consultants, evaluation of patient's response to treatment, examination of patient, ordering and review of laboratory studies, ordering and review of radiographic studies, ordering and performing treatments and interventions, pulse oximetry, re-evaluation of patient's condition and review of old charts ?  I assumed direction of critical care for this patient from another provider in my specialty: no   ?Marland Kitchen.Laceration Repair ? ?Date/Time: 11/21/2021 2:58 PM ?Performed by: Lorelle Gibbs, DO ?Authorized by: Lorelle Gibbs, DO  ? ?Consent:  ?  Consent obtained:  Verbal ?  Consent given by:  Patient ?  Risks discussed:  Need for additional repair, poor cosmetic result and poor wound healing ?  Alternatives discussed:  No treatment ?Anesthesia:  ?  Anesthesia method:  None ?Laceration details:  ?  Location:  Face ?  Face location:  Nose ?  Length (cm):  1 ?Pre-procedure details:  ?  Preparation:  Patient was prepped and draped in usual sterile fashion ?Exploration:  ?  Hemostasis achieved with:  Direct pressure ?  Imaging outcome: foreign body not noted   ?Treatment:  ?  Area cleansed with:  Saline ?  Irrigation solution:  Sterile saline ?Skin repair:  ?  Repair method:   Tissue adhesive ?Approximation:  ?  Approximation:  Close ?Repair type:  ?  Repair type:  Simple ?Post-procedure details:  ?  Dressing:  Open (no dressing) ?  Procedure completion:  Tolerated  ? ? ?Medications Ordered i

## 2021-11-21 NOTE — Discharge Instructions (Addendum)
You have been seen and discharged from the emergency department.  Your head, face and neck CT were normal.  X-rays showed no acute fracture.  There was mention of chronic appearing pubic rami fracture.  Skin glue was placed on the small cut of your nasal bridge.  This will dissolve on its own.  Your tetanus was updated at this visit.  Follow-up with your primary provider for further evaluation and further care. Take home medications as prescribed. If you have any worsening symptoms or further concerns for your health please return to an emergency department for further evaluation. ?

## 2021-11-21 NOTE — ED Notes (Signed)
Patient transported to CT with TRN.  

## 2021-11-21 NOTE — ED Notes (Signed)
PTAR called  

## 2021-11-21 NOTE — Progress Notes (Signed)
?  11/21/21 1059  ?Clinical Encounter Type  ?Visited With Health care provider;Patient not available  ?Visit Type ED;Trauma;Initial  ?Referral From Nurse  ?Consult/Referral To Nurse  ? ?Responded to page in E.D. [facility] Room 11 for Level 2 Trauma for fall on thinners patient. Ms. Sharon Campbell being evaluated and treated by medical staff at this time. Met Ms. [name]  who was alert and oriented at bedside. Ms. [name] stated that her son [name] was enroute to the hospital. No family present at this time. Staff will page Chaplain upon request of patient or family. Chaplain Samanvitha Germany, M.Min., [phone].   ?

## 2021-11-21 NOTE — ED Notes (Signed)
PT[facility] cancelled, son taking her home to [facility] ?

## 2021-11-21 NOTE — ED Notes (Signed)
Wheeled patient to the bathroom patient did well getting up and down out of the wheeled chair patient is now back in bed with bed rails up with call bell in reach   ?

## 2021-11-21 NOTE — ED Notes (Signed)
Trauma Response Nurse Documentation ? ? ?Sharon Campbell is a 87 y.o. female arriving to [facility] ED via EMS ? ?On clopidogrel 75 mg daily. Trauma was activated as a Level 2 by ED Charge RN based on the following trauma criteria Elderly patients > 65 with head trauma on anti-coagulation (excluding ASA). Trauma RN at the bedside on patient arrival. Patient cleared for CT by Dr. Dina Rich. Patient to CT with team. GCS 15. ? ?History  ? Past Medical History:  ?Diagnosis Date  ? Glaucoma   ? High cholesterol   ? Hypertension   ? Thyroid disease   ?  ? Past Surgical History:  ?Procedure Laterality Date  ? CESAREAN SECTION    ? x 2  ? CORONARY STENT INTERVENTION N/A 10/28/2021  ? Procedure: CORONARY STENT INTERVENTION;  Surgeon: Early Osmond, MD;  Location: [place] [facility];  Service: Cardiovascular;  Laterality: N/A;  ? INTRAVASCULAR ULTRASOUND/IVUS N/A 10/28/2021  ? Procedure: Intravascular Ultrasound/IVUS;  Surgeon: Early Osmond, MD;  Location: [place] [facility];  Service: Cardiovascular;  Laterality: N/A;  ? RIGHT/LEFT HEART CATH AND CORONARY ANGIOGRAPHY N/A 10/28/2021  ? Procedure: RIGHT/LEFT HEART CATH AND CORONARY ANGIOGRAPHY;  Surgeon: Early Osmond, MD;  Location: [place] [facility];  Service: Cardiovascular;  Laterality: N/A;  ?  ? ? ?Initial Focused Assessment (If applicable, or please see trauma documentation): ?- GCS 15 A/O x4 ?- bleeding/deformity to bridge of nose ?- VS WDL ?- C- collar in place ?- 18G to L AC ? ?CT's Completed:   ?CT Head, CT Maxillofacial, and CT C-Spine  ? ?Interventions:  ?- Manual BP done (118/60)  ?- CXR / Pelvic XR ?- CT head, neck and face ? ?Plan for disposition:  ?Other  ? ?Consults completed:  ?none at 1200. ? ?Event Summary: ?Pt is from [facility] and was sleeping near the edge of the bed when she fell off hitting her head.  Pt takes plavix.  [facility] brought pt to ED.  C-collar in place.  Lac and deformity to bridge of nose. ? ?MTP Summary (If applicable):  n/a ?Bedside handoff with ED RN Camryn.   ? ?Dulcy Fanny W  ?Trauma Response RN ? ?Please call TRN at [phone] for further assistance. ? ? ?

## 2021-11-21 NOTE — ED Triage Notes (Signed)
Pt BIB [facility] from [facility]. Pt arrives as a level II fall on thinners. Pt was sleeping near the edge of her bed and fell off the bed, hitting her head. Pt takes plavix. Pt has deformity to her nose and lac to top of the head. C-collar in place by EMS. 18g L AC by EMS. EMS VS 123/55, HR 84, SpO2 94% on RA, CBG 164 ?

## 2021-11-23 ENCOUNTER — Ambulatory Visit (INDEPENDENT_AMBULATORY_CARE_PROVIDER_SITE_OTHER): Payer: Medicare Other | Admitting: Cardiology

## 2021-11-23 ENCOUNTER — Other Ambulatory Visit: Payer: Self-pay

## 2021-11-23 VITALS — BP 110/56 | HR 88 | Ht 60.0 in | Wt 112.4 lb

## 2021-11-23 DIAGNOSIS — R55 Syncope and collapse: Secondary | ICD-10-CM

## 2021-11-23 DIAGNOSIS — I350 Nonrheumatic aortic (valve) stenosis: Secondary | ICD-10-CM

## 2021-11-23 DIAGNOSIS — I10 Essential (primary) hypertension: Secondary | ICD-10-CM

## 2021-11-23 DIAGNOSIS — I25118 Atherosclerotic heart disease of native coronary artery with other forms of angina pectoris: Secondary | ICD-10-CM

## 2021-11-23 DIAGNOSIS — I5032 Chronic diastolic (congestive) heart failure: Secondary | ICD-10-CM

## 2021-11-23 NOTE — Patient Instructions (Signed)
Medication Instructions:  ?Your physician recommends that you continue on your current medications as directed. Please refer to the Current Medication list given to you today.  ?*If you need a refill on your cardiac medications before your next appointment, please call your pharmacy* ? ? ?Lab Work: ?NONE ?If you have labs (blood work) drawn today and your tests are completely normal, you will receive your results only by: ?MyChart Message (if you have MyChart) OR ?A paper copy in the mail ?If you have any lab test that is abnormal or we need to change your treatment, we will call you to review the results. ? ? ?Testing/Procedures: ?NONE ? ? ?Follow-Up: ?At [facility], you and your health needs are our priority.  As part of our continuing mission to provide you with exceptional heart care, we have created designated Provider Care Teams.  These Care Teams include your primary Cardiologist (physician) and Advanced Practice Providers (APPs -  Physician Assistants and Nurse Practitioners) who all work together to provide you with the care you need, when you need it. ? ?We recommend signing up for the patient portal called "MyChart".  Sign up information is provided on this After Visit Summary.  MyChart is used to connect with patients for Virtual Visits (Telemedicine).  Patients are able to view lab/test results, encounter notes, upcoming appointments, etc.  Non-urgent messages can be sent to your provider as well.   ?To learn more about what you can do with MyChart, go to [email or web address].   ? ?Your next appointment:   ?KEEP SCHEDULED FOLLOW-UP ?

## 2021-11-24 NOTE — Progress Notes (Signed)
?Cardiology Office Note:   ? ?Date:  11/25/2021  ? ?ID:  Sharon Campbell, DOB Sep 20, 1934, MRN [id number] ? ?PCP:  [name], MD  ? ?[name] Providers ?Cardiologist:  Lenna Sciara, MD ?Referring MD: [name], MD  ? ?Chief Complaint/Reason for Referral: Follow-up ? ?ASSESSMENT:   ? ?1. Severe aortic stenosis   ?2. Fatigue, unspecified type   ? ? ?PLAN:   ? ?In order of problems listed above: ?1.  The patient is very frail, debilitated, and malnourished.  I had a long conversation with her and her family about the prospect of an aortic valve intervention I do not think this is in her best interest.  I do not think that she would benefit at all from a TAVR procedure.  We had a conversation about potential for her management options including palliative care versus assisted living with some rehabilitation component.  She is already DNR/DNI per her family.  I left it up to the patient and her family to decide whether they should pursue palliative care versus assisted living with rehabilitation.  For now we will continue her aspirin and Plavix.  I have asked him to take Lasix on a as needed basis.  I will have her see Sharon Campbell in 3 months but I did tell them we could cancel this appointment if needed.  They will think about these important issues and make a decision moving forwards. ? ? ? ? ?Cardiac Rehabilitation Eligibility Assessment  ?The patient has declined or is not appropriate for cardiac rehabilitation. ?     ? ? ?Dispo:  Return in about 3 months (around 02/25/2022).  ? ?  ? ?Medication Adjustments/Labs and Tests Ordered: ?Current medicines are reviewed at length with the patient today.  Concerns regarding medicines are outlined above. ? ?The following changes have been made:  None ? ?Labs/tests ordered: ?No orders of the defined types were placed in this encounter. ? ? ?Medication Changes: ?No orders of the defined types were placed in this encounter. ? ? ? ?Current medicines are  reviewed at length with the patient today.  The patient does not have concerns regarding medicines. ? ? ?History of Present Illness:   ? ?FOCUSED PROBLEM LIST:   ?1.  Severe aortic stenosis with a aortic valve area 0.6 cm grade, mean gradient 57 mmHg, and peak velocity of 4.9 m/s with moderate aortic insufficiency; ejection fraction 60 to 65%; no conduction abnormalities ?2.  Hypertension ?3.  Hyperlipidemia ?4.  Chronic kidney disease with creatinine of 1.23, GFR 40s ?5.  Coronary artery disease s/p LM and OM PCI March 2023 ? ?The patient is a 87 y.o. female with the indicated medical history here for expedited follow-up.  Patient was seen by nurse practitioner Sharon Campbell.  The patient was very debilitated.  She is accompanied with her son and grandson.  Her son took her out of an independent living center because she was so debilitated and unable to move.  She also suffered a fall and required emergency room visit due to bleeding from her nose.  She is now living with him.  She is quite fatigued and has a poor appetite.  She is tired all basically all the time.  She does get short of breath and has chest pain when she exerts herself.  She has had no severe bleeding or bruising however she tells me she occasionally gets dark stools.  She does not know whether she has hemorrhoids or not.  Her hemoglobin had been stable when it  was checked previously.  She is today rather depressed and very tired with having to deal with her medical issues. ? ?   ?  ?Previous Medical History: ?Past Medical History:  ?Diagnosis Date  ? Glaucoma   ? High cholesterol   ? Hypertension   ? Thyroid disease   ? ? ? ?Current Medications: ?Current Meds  ?Medication Sig  ? acetaminophen (TYLENOL) 500 MG tablet Take 500 mg by mouth daily as needed for headache.  ? ALPRAZolam (XANAX) 0.25 MG tablet Take 0.5 tablets by mouth at bedtime. For anxiety  ? aspirin 81 MG chewable tablet Chew 1 tablet (81 mg total) by mouth daily.  ? atorvastatin  (LIPITOR) 80 MG tablet Take 1 tablet (80 mg total) by mouth daily.  ? carvedilol (COREG) 6.25 MG tablet Take 1 tablet (6.25 mg total) by mouth 2 (two) times daily with a meal.  ? clopidogrel (PLAVIX) 75 MG tablet Take 1 tablet (75 mg total) by mouth daily with breakfast.  ? dorzolamide-timolol (COSOPT) 22.3-6.8 MG/ML ophthalmic solution Place 1 drop into the right eye at bedtime.  ? fluvoxaMINE (LUVOX) 100 MG tablet Take 2 tablets (200 mg total) by mouth at bedtime.  ? furosemide (LASIX) 40 MG tablet Take 1 tablet (40 mg total) by mouth daily.  ? latanoprost (XALATAN) 0.005 % ophthalmic solution Place 1 drop into both eyes at bedtime.   ? Multiple Vitamin (MULTIVITAMIN WITH MINERALS) TABS Take 1 tablet by mouth daily.  ? nitroGLYCERIN (NITROSTAT) 0.4 MG SL tablet Place 1 tablet (0.4 mg total) under the tongue every 5 (five) minutes as needed for chest pain.  ? SYNTHROID 88 MCG tablet Take 88 mcg by mouth daily.   ?  ? ?Allergies:    ?Cyclobenzaprine, Hydrocodone-acetaminophen, and Prozac [fluoxetine]  ? ?Social History:   ?Social History  ? ?Tobacco Use  ? Smoking status: Former  ?  Types: Cigarettes  ? Smokeless tobacco: Never  ? Tobacco comments:  ?  Quit around 89  ?Substance Use Topics  ? Alcohol use: Yes  ?  Comment: rarely  ? Drug use: Never  ?  ? ?Family Hx: ?Family History  ?Problem Relation Age of Onset  ? Hyperlipidemia Mother   ? Cancer Father   ?  ? ?Review of Systems:   ?Please see the history of present illness.    ?All other systems reviewed and are negative. ?  ? ? ?EKGs/Labs/Other Test Reviewed:   ? ?EKG:  EKG is not ordered today. ? ? ?Prior CV studies: ? ? ?Right and Left Heart Catheterization 10/28/21  ?  Ost LM to Mid LM lesion is 60% stenosed. ?  RPDA lesion is 50% stenosed. ?  Prox LAD lesion is 40% stenosed. ?  Mid LAD-1 lesion is 60% stenosed. ?  Mid LAD-2 lesion is 50% stenosed. ?  1st Mrg lesion is 80% stenosed. ?  Dist Cx lesion is 99% stenosed. ?  A stent was successfully placed. ?  A  stent was successfully placed. ?  Post intervention, there is a 0% residual stenosis. ?  Post intervention, there is a 0% residual stenosis. ?  LV end diastolic pressure is severely elevated. ?  The left ventricular ejection fraction is 50-55% by visual estimate. ?  ?1.  High-grade obtuse marginal treated with 1 drug-eluting stent and 60% left main treated with 1 drug-eluting stent with IVUS optimization.  There is mild to moderate disease of the LAD and PDA and a short chronic total occlusion of the mid left  circumflex will be which will be treated medically. ?2.  Cardiac output of 3.7 to 2 L/min and index of 2.5 L/min/m? with the following hemodynamics: ?  ?Right atrial pressure 16/13 with a mean of 12 mmHg ?Right ventricular pressure 56/9 with a RV end-diastolic pressure of 21 mmHg ?Wedge pressure 26/30 with a mean of 22 mmHg ?PA pressure 55/22 with a mean of 34 mmHg; PAPI 2.5 ?LVEDP 41mmHg ?  ?Recommendations: Dual antiplatelet therapy and consider low-dose diuretics on discharge. ? ? ?TTE 1/23 ? 1. Left ventricular ejection fraction, by estimation, is 60 to 65%. The  ?left ventricle has normal function. The left ventricle has no regional  ?wall motion abnormalities. There is moderate asymmetric left ventricular  ?hypertrophy of the basal-septal  ?segment. Left ventricular diastolic parameters are consistent with Grade  ?II diastolic dysfunction (pseudonormalization). Elevated left atrial  ?pressure.  ? 2. Right ventricular systolic function is normal. The right ventricular  ?size is normal. There is mildly elevated pulmonary artery systolic  ?pressure. The estimated right ventricular systolic pressure is 123XX123 mmHg.  ? 3. Left atrial size was moderately dilated.  ? 4. The mitral valve is normal in structure. Mild to moderate mitral valve  ?regurgitation.  ? 5. The inferior vena cava is normal in size with greater than 50%  ?respiratory variability, suggesting right atrial pressure of 3 mmHg.  ? 6. The aortic  valve was not well visualized. There is severe calcifcation  ?of the aortic valve. Aortic valve regurgitation is moderate. Severe aortic  ?valve stenosis. Vmax 4.9 m/s, MG 50mmHg, AVA 0.6 cm^2, DI 0.20  ? ?Other studies

## 2021-11-25 ENCOUNTER — Ambulatory Visit: Payer: Medicare Other | Admitting: Internal Medicine

## 2021-11-25 ENCOUNTER — Other Ambulatory Visit: Payer: Self-pay

## 2021-11-25 ENCOUNTER — Encounter: Payer: Self-pay | Admitting: Internal Medicine

## 2021-11-25 VITALS — BP 102/60 | HR 89 | Ht 60.0 in | Wt 111.6 lb

## 2021-11-25 DIAGNOSIS — I350 Nonrheumatic aortic (valve) stenosis: Secondary | ICD-10-CM

## 2021-11-25 DIAGNOSIS — R5383 Other fatigue: Secondary | ICD-10-CM

## 2021-11-25 NOTE — Patient Instructions (Signed)
Medication Instructions:  ?No changes today ?*If you need a refill on your cardiac medications before your next appointment, please call your pharmacy* ? ? ?Lab Work: ?No labs ordered today ? ?Testing/Procedures: ?none ? ? ?Follow-Up: ?3 months with Georgie Chard, NP ? ? ?  ?

## 2021-12-28 DEATH — deceased

## 2022-01-04 ENCOUNTER — Ambulatory Visit: Payer: Medicare Other | Admitting: Internal Medicine

## 2022-02-17 ENCOUNTER — Ambulatory Visit: Payer: Medicare Other

## 2023-03-03 IMAGING — DX DG CHEST 1V PORT
1 series · 1 of 1 positions shown · non-contrast
Comparison: None.

CLINICAL DATA: Syncope.

EXAM:
PORTABLE CHEST 1 VIEW

[chest ap]
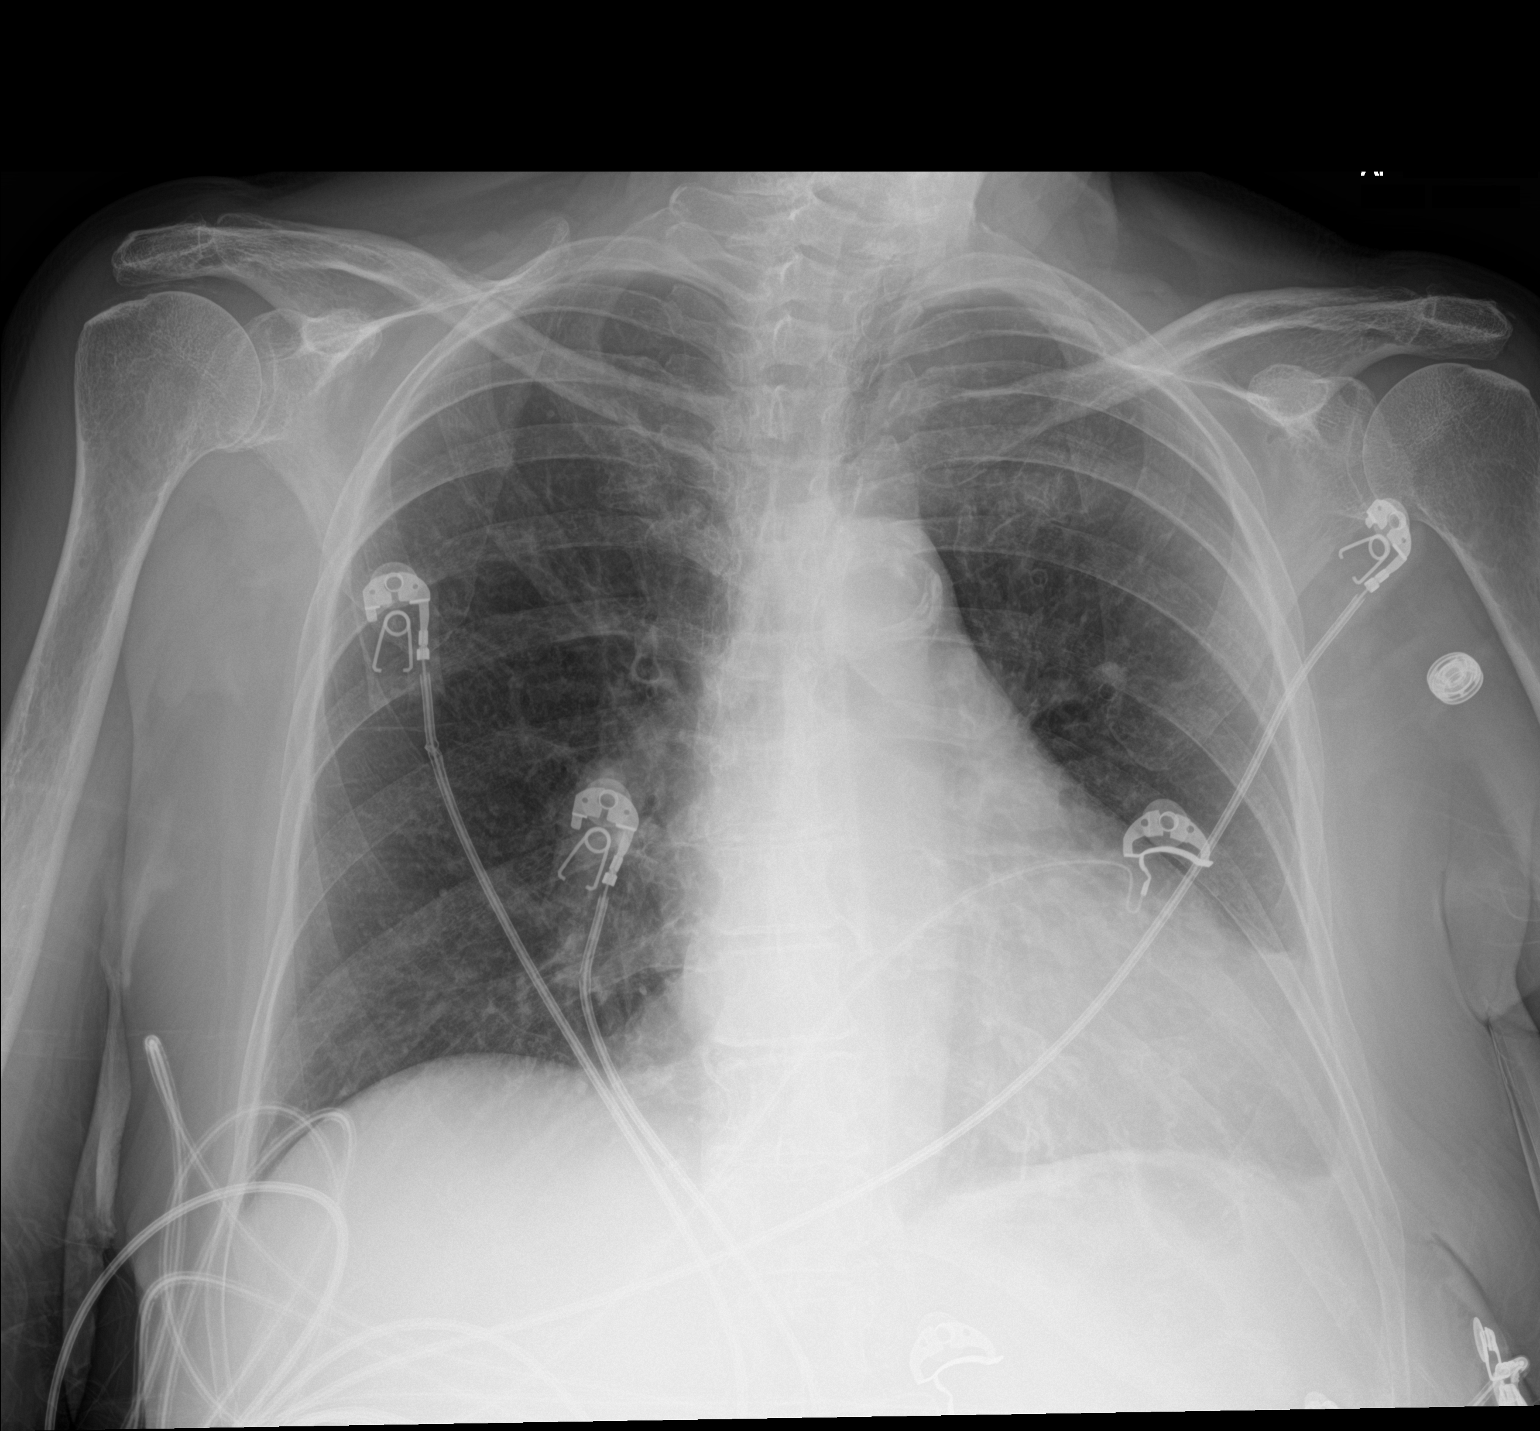

[1 of 1 positions shown; findings below may reference images not displayed]

FINDINGS: No focal consolidation, pleural effusion or pneumothorax. Minimal
left lung base atelectasis. Borderline cardiomegaly. Atherosclerotic
calcification of the aorta. No acute osseous pathology.
IMPRESSION: No active disease.
